# Patient Record
Sex: Male | Born: 1965 | Race: Black or African American | Hispanic: No | Smoking: Never smoker
Health system: Southern US, Community
[De-identification: ages and names within clinical notes are randomized; demographics above are authoritative.]

## PROBLEM LIST (undated history)

## (undated) DIAGNOSIS — J42 Unspecified chronic bronchitis: Secondary | ICD-10-CM

## (undated) DIAGNOSIS — E119 Type 2 diabetes mellitus without complications: Secondary | ICD-10-CM

## (undated) DIAGNOSIS — Q249 Congenital malformation of heart, unspecified: Secondary | ICD-10-CM

## (undated) DIAGNOSIS — N289 Disorder of kidney and ureter, unspecified: Secondary | ICD-10-CM

## (undated) DIAGNOSIS — I10 Essential (primary) hypertension: Secondary | ICD-10-CM

## (undated) HISTORY — PX: CARDIAC SURGERY: SHX584

## (undated) HISTORY — PX: NEPHRECTOMY TRANSPLANTED ORGAN: SUR880

## (undated) HISTORY — PX: INSERTION OF DIALYSIS CATHETER: SHX1324

---

## 2016-10-24 ENCOUNTER — Inpatient Hospital Stay (HOSPITAL_COMMUNITY): Payer: Medicare Other

## 2016-10-24 ENCOUNTER — Inpatient Hospital Stay (HOSPITAL_COMMUNITY)
Admission: EM | Admit: 2016-10-24 | Discharge: 2016-10-27 | DRG: 682 | Disposition: A | Discharge status: Home / Self Care | Payer: Medicare Other | Attending: Family Medicine | Admitting: Family Medicine

## 2016-10-24 ENCOUNTER — Encounter (HOSPITAL_COMMUNITY): Payer: Self-pay | Admitting: Emergency Medicine

## 2016-10-24 ENCOUNTER — Emergency Department (HOSPITAL_COMMUNITY): Payer: Medicare Other

## 2016-10-24 DIAGNOSIS — D631 Anemia in chronic kidney disease: Secondary | ICD-10-CM | POA: Diagnosis present

## 2016-10-24 DIAGNOSIS — N186 End stage renal disease: Secondary | ICD-10-CM

## 2016-10-24 DIAGNOSIS — E875 Hyperkalemia: Secondary | ICD-10-CM

## 2016-10-24 DIAGNOSIS — N179 Acute kidney failure, unspecified: Principal | ICD-10-CM

## 2016-10-24 DIAGNOSIS — I350 Nonrheumatic aortic (valve) stenosis: Secondary | ICD-10-CM

## 2016-10-24 DIAGNOSIS — Z862 Personal history of diseases of the blood and blood-forming organs and certain disorders involving the immune mechanism: Secondary | ICD-10-CM

## 2016-10-24 DIAGNOSIS — Z940 Kidney transplant status: Secondary | ICD-10-CM

## 2016-10-24 DIAGNOSIS — N184 Chronic kidney disease, stage 4 (severe): Secondary | ICD-10-CM

## 2016-10-24 DIAGNOSIS — E119 Type 2 diabetes mellitus without complications: Secondary | ICD-10-CM

## 2016-10-24 DIAGNOSIS — R600 Localized edema: Secondary | ICD-10-CM

## 2016-10-24 DIAGNOSIS — J9691 Respiratory failure, unspecified with hypoxia: Secondary | ICD-10-CM

## 2016-10-24 DIAGNOSIS — J9601 Acute respiratory failure with hypoxia: Secondary | ICD-10-CM

## 2016-10-24 DIAGNOSIS — I129 Hypertensive chronic kidney disease with stage 1 through stage 4 chronic kidney disease, or unspecified chronic kidney disease: Secondary | ICD-10-CM | POA: Diagnosis present

## 2016-10-24 DIAGNOSIS — R0600 Dyspnea, unspecified: Secondary | ICD-10-CM | POA: Diagnosis not present

## 2016-10-24 DIAGNOSIS — I5032 Chronic diastolic (congestive) heart failure: Secondary | ICD-10-CM | POA: Diagnosis present

## 2016-10-24 DIAGNOSIS — I080 Rheumatic disorders of both mitral and aortic valves: Secondary | ICD-10-CM | POA: Diagnosis present

## 2016-10-24 DIAGNOSIS — N189 Chronic kidney disease, unspecified: Secondary | ICD-10-CM

## 2016-10-24 DIAGNOSIS — R0902 Hypoxemia: Secondary | ICD-10-CM | POA: Diagnosis present

## 2016-10-24 DIAGNOSIS — E1122 Type 2 diabetes mellitus with diabetic chronic kidney disease: Secondary | ICD-10-CM | POA: Diagnosis present

## 2016-10-24 DIAGNOSIS — E8779 Other fluid overload: Secondary | ICD-10-CM

## 2016-10-24 DIAGNOSIS — Z8249 Family history of ischemic heart disease and other diseases of the circulatory system: Secondary | ICD-10-CM

## 2016-10-24 DIAGNOSIS — E8770 Fluid overload, unspecified: Secondary | ICD-10-CM

## 2016-10-24 DIAGNOSIS — J42 Unspecified chronic bronchitis: Secondary | ICD-10-CM | POA: Diagnosis present

## 2016-10-24 HISTORY — DX: Disorder of kidney and ureter, unspecified: N28.9

## 2016-10-24 HISTORY — DX: Essential (primary) hypertension: I10

## 2016-10-24 HISTORY — DX: Unspecified chronic bronchitis: J42

## 2016-10-24 HISTORY — DX: Congenital malformation of heart, unspecified: Q24.9

## 2016-10-24 HISTORY — DX: Type 2 diabetes mellitus without complications: E11.9

## 2016-10-24 LAB — BASIC METABOLIC PANEL
Anion gap: 9 (ref 5–15)
BUN: 56 mg/dL — AB (ref 6–20)
CALCIUM: 8.9 mg/dL (ref 8.9–10.3)
CHLORIDE: 104 mmol/L (ref 101–111)
CO2: 23 mmol/L (ref 22–32)
CREATININE: 5.83 mg/dL — AB (ref 0.61–1.24)
GFR, EST AFRICAN AMERICAN: 12 mL/min — AB (ref >60)
GFR, EST NON AFRICAN AMERICAN: 10 mL/min — AB (ref >60)
Glucose, Bld: 193 mg/dL — ABNORMAL HIGH (ref 65–99)
Potassium: 5.7 mmol/L — ABNORMAL HIGH (ref 3.5–5.1)
SODIUM: 136 mmol/L (ref 135–145)

## 2016-10-24 LAB — CBC WITH DIFFERENTIAL/PLATELET
BASOS PCT: 0 %
Basophils Absolute: 0 10*3/uL (ref 0.0–0.1)
EOS ABS: 0 10*3/uL (ref 0.0–0.7)
EOS PCT: 0 %
HCT: 43 % (ref 39.0–52.0)
HEMOGLOBIN: 13.7 g/dL (ref 13.0–17.0)
Lymphocytes Relative: 6 %
Lymphs Abs: 0.3 10*3/uL — ABNORMAL LOW (ref 0.7–4.0)
MCH: 28.4 pg (ref 26.0–34.0)
MCHC: 31.9 g/dL (ref 30.0–36.0)
MCV: 89.2 fL (ref 78.0–100.0)
MONOS PCT: 1 %
Monocytes Absolute: 0.1 10*3/uL (ref 0.1–1.0)
NEUTROS PCT: 93 %
Neutro Abs: 4.4 10*3/uL (ref 1.7–7.7)
PLATELETS: 207 10*3/uL (ref 150–400)
RBC: 4.82 MIL/uL (ref 4.22–5.81)
RDW: 14.6 % (ref 11.5–15.5)
WBC: 4.8 10*3/uL (ref 4.0–10.5)

## 2016-10-24 LAB — ECHOCARDIOGRAM COMPLETE
AO mean calculated velocity dopler: 222 cm/s
AOPV: 0.32 m/s
AOVTI: 67.2 cm
AV Area VTI: 0.73 cm2
AV Mean grad: 23 mmHg
AV area mean vel ind: 0.35 cm2/m2
AV vel: 0.77
AVA: 0.77 cm2
AVAREAMEANV: 0.7 cm2
AVAREAVTIIND: 0.39 cm2/m2
AVCELMEANRAT: 0.31
AVLVOTPG: 5 mmHg
AVPG: 45 mmHg
AVPKVEL: 334 cm/s
CHL CUP AV PEAK INDEX: 0.37
CHL CUP AV VALUE AREA INDEX: 0.39
CHL CUP DOP CALC LVOT VTI: 22.7 cm
EERAT: 19.68
EWDT: 201 ms
FS: 40 % (ref 28–44)
Height: 67 in
IVS/LV PW RATIO, ED: 0.98
LA diam end sys: 49 mm
LADIAMINDEX: 2.45 cm/m2
LASIZE: 49 mm
LAVOL: 130 mL
LAVOLA4C: 112 mL
LAVOLIN: 65.1 mL/m2
LDCA: 2.27 cm2
LV E/e' medial: 19.68
LV PW d: 15.2 mm — AB (ref 0.6–1.1)
LV TDI E'LATERAL: 6.96
LV dias vol index: 48 mL/m2
LV e' LATERAL: 6.96 cm/s
LV sys vol: 33 mL (ref 21–61)
LVDIAVOL: 97 mL (ref 62–150)
LVEEAVG: 19.68
LVOT SV: 52 mL
LVOT diameter: 17 mm
LVOT peak VTI: 0.34 cm
LVOTPV: 107 cm/s
LVSYSVOLIN: 16 mL/m2
MV Dec: 201
MV pk A vel: 76.4 m/s
MV pk E vel: 137 m/s
MVPG: 8 mmHg
RV LATERAL S' VELOCITY: 16.8 cm/s
RV TAPSE: 24.3 mm
Simpson's disk: 66
Stroke v: 64 ml
TDI e' medial: 7.72
Weight: 2913.6 oz

## 2016-10-24 LAB — GLUCOSE, CAPILLARY
GLUCOSE-CAPILLARY: 110 mg/dL — AB (ref 65–99)
GLUCOSE-CAPILLARY: 214 mg/dL — AB (ref 65–99)
GLUCOSE-CAPILLARY: 159 mg/dL — AB (ref 65–99)
Glucose-Capillary: 252 mg/dL — ABNORMAL HIGH (ref 65–99)

## 2016-10-24 LAB — RENAL FUNCTION PANEL
Albumin: 2.8 g/dL — ABNORMAL LOW (ref 3.5–5.0)
Anion gap: 8 (ref 5–15)
BUN: 56 mg/dL — AB (ref 6–20)
CALCIUM: 8.8 mg/dL — AB (ref 8.9–10.3)
CHLORIDE: 102 mmol/L (ref 101–111)
CO2: 26 mmol/L (ref 22–32)
CREATININE: 5.86 mg/dL — AB (ref 0.61–1.24)
GFR calc Af Amer: 12 mL/min — ABNORMAL LOW (ref >60)
GFR, EST NON AFRICAN AMERICAN: 10 mL/min — AB (ref >60)
Glucose, Bld: 142 mg/dL — ABNORMAL HIGH (ref 65–99)
Phosphorus: 5.5 mg/dL — ABNORMAL HIGH (ref 2.5–4.6)
Potassium: 4.5 mmol/L (ref 3.5–5.1)
SODIUM: 136 mmol/L (ref 135–145)

## 2016-10-24 LAB — URINALYSIS, ROUTINE W REFLEX MICROSCOPIC
Bilirubin Urine: NEGATIVE
GLUCOSE, UA: 250 mg/dL — AB
KETONES UR: NEGATIVE mg/dL
Leukocytes, UA: NEGATIVE
Nitrite: NEGATIVE
PH: 5.5 (ref 5.0–8.0)
Specific Gravity, Urine: 1.025 (ref 1.005–1.030)

## 2016-10-24 LAB — URINE MICROSCOPIC-ADD ON

## 2016-10-24 LAB — TROPONIN I

## 2016-10-24 LAB — BRAIN NATRIURETIC PEPTIDE: B NATRIURETIC PEPTIDE 5: 670 pg/mL — AB (ref 0.0–100.0)

## 2016-10-24 LAB — MRSA PCR SCREENING: MRSA BY PCR: NEGATIVE

## 2016-10-24 MED ORDER — ONDANSETRON HCL 4 MG PO TABS: 4.0000 mg | ORAL_TABLET | Freq: Four times a day (QID) | ORAL | Status: DC | PRN

## 2016-10-24 MED ORDER — LABETALOL HCL 200 MG PO TABS
600.0000 mg | ORAL_TABLET | Freq: Two times a day (BID) | ORAL | Status: DC
  Administered 2016-10-24 – 2016-10-27 (×7): 600 mg via ORAL
  Filled 2016-10-24 (×7): qty 3

## 2016-10-24 MED ORDER — SODIUM CHLORIDE 0.9 % IV SOLN: 250.0000 mL | INTRAVENOUS | Status: DC | PRN

## 2016-10-24 MED ORDER — OXYCODONE-ACETAMINOPHEN 5-325 MG PO TABS
1.0000 | ORAL_TABLET | Freq: Once | ORAL | Status: AC
  Administered 2016-10-24: 1 via ORAL
  Filled 2016-10-24: qty 1

## 2016-10-24 MED ORDER — SODIUM CHLORIDE 0.9% FLUSH: 3.0000 mL | INTRAVENOUS | Status: DC | PRN

## 2016-10-24 MED ORDER — ACETAMINOPHEN 325 MG PO TABS
650.0000 mg | ORAL_TABLET | Freq: Four times a day (QID) | ORAL | Status: DC | PRN
Start: 2016-10-24 — End: 2016-10-27
  Administered 2016-10-24 – 2016-10-25 (×2): 650 mg via ORAL
  Filled 2016-10-24 (×2): qty 2

## 2016-10-24 MED ORDER — ONDANSETRON HCL 4 MG/2ML IJ SOLN
4.0000 mg | Freq: Four times a day (QID) | INTRAMUSCULAR | Status: DC | PRN
  Administered 2016-10-24: 4 mg via INTRAVENOUS
  Filled 2016-10-24: qty 2

## 2016-10-24 MED ORDER — INSULIN ASPART 100 UNIT/ML IV SOLN
10.0000 [IU] | Freq: Once | INTRAVENOUS | Status: AC
  Administered 2016-10-24: 10 [IU] via INTRAVENOUS

## 2016-10-24 MED ORDER — PANTOPRAZOLE SODIUM 40 MG PO TBEC
40.0000 mg | DELAYED_RELEASE_TABLET | Freq: Two times a day (BID) | ORAL | Status: DC
  Administered 2016-10-24 – 2016-10-27 (×7): 40 mg via ORAL
  Filled 2016-10-24 (×7): qty 1

## 2016-10-24 MED ORDER — FUROSEMIDE 10 MG/ML IJ SOLN
80.0000 mg | Freq: Once | INTRAMUSCULAR | Status: AC
  Administered 2016-10-24: 80 mg via INTRAVENOUS
  Filled 2016-10-24: qty 8

## 2016-10-24 MED ORDER — SODIUM CHLORIDE 0.9 % IV SOLN
1.0000 g | Freq: Once | INTRAVENOUS | Status: AC
  Administered 2016-10-24: 1 g via INTRAVENOUS
  Filled 2016-10-24: qty 10

## 2016-10-24 MED ORDER — ALBUTEROL SULFATE (2.5 MG/3ML) 0.083% IN NEBU
2.5000 mg | INHALATION_SOLUTION | Freq: Four times a day (QID) | RESPIRATORY_TRACT | Status: DC
  Administered 2016-10-24 – 2016-10-27 (×13): 2.5 mg via RESPIRATORY_TRACT
  Filled 2016-10-24 (×13): qty 3

## 2016-10-24 MED ORDER — ACETAMINOPHEN 650 MG RE SUPP
650.0000 mg | Freq: Four times a day (QID) | RECTAL | Status: DC | PRN
Start: 2016-10-24 — End: 2016-10-27

## 2016-10-24 MED ORDER — FUROSEMIDE 10 MG/ML IJ SOLN: 80.0000 mg | Freq: Two times a day (BID) | INTRAMUSCULAR | Status: DC

## 2016-10-24 MED ORDER — PREDNISONE 10 MG PO TABS
10.0000 mg | ORAL_TABLET | Freq: Every day | ORAL | Status: DC
  Administered 2016-10-24 – 2016-10-27 (×4): 10 mg via ORAL
  Filled 2016-10-24 (×4): qty 1

## 2016-10-24 MED ORDER — TACROLIMUS 1 MG PO CAPS
4.0000 mg | ORAL_CAPSULE | Freq: Two times a day (BID) | ORAL | Status: DC
  Administered 2016-10-24 – 2016-10-27 (×7): 4 mg via ORAL
  Filled 2016-10-24 (×13): qty 4

## 2016-10-24 MED ORDER — HEPARIN SODIUM (PORCINE) 5000 UNIT/ML IJ SOLN
5000.0000 [IU] | Freq: Three times a day (TID) | INTRAMUSCULAR | Status: DC
  Administered 2016-10-24 – 2016-10-27 (×10): 5000 [IU] via SUBCUTANEOUS
  Filled 2016-10-24 (×10): qty 1

## 2016-10-24 MED ORDER — DEXTROSE 5 % IV SOLN
120.0000 mg | Freq: Two times a day (BID) | INTRAVENOUS | Status: DC
  Administered 2016-10-24 – 2016-10-26 (×6): 120 mg via INTRAVENOUS
  Filled 2016-10-24 (×13): qty 12

## 2016-10-24 MED ORDER — INSULIN ASPART 100 UNIT/ML ~~LOC~~ SOLN
SUBCUTANEOUS | Status: AC
  Filled 2016-10-24: qty 1

## 2016-10-24 MED ORDER — DEXTROSE 50 % IV SOLN
INTRAVENOUS | Status: AC
  Filled 2016-10-24: qty 50

## 2016-10-24 MED ORDER — SODIUM POLYSTYRENE SULFONATE 15 GM/60ML PO SUSP
15.0000 g | Freq: Once | ORAL | Status: AC
  Administered 2016-10-24: 15 g via ORAL
  Filled 2016-10-24: qty 60

## 2016-10-24 MED ORDER — SODIUM CHLORIDE 0.9% FLUSH
3.0000 mL | Freq: Two times a day (BID) | INTRAVENOUS | Status: DC
  Administered 2016-10-24 – 2016-10-26 (×5): 3 mL via INTRAVENOUS

## 2016-10-24 MED ORDER — INSULIN ASPART 100 UNIT/ML ~~LOC~~ SOLN
0.0000 [IU] | Freq: Three times a day (TID) | SUBCUTANEOUS | Status: DC
  Administered 2016-10-24: 3 [IU] via SUBCUTANEOUS
  Administered 2016-10-24: 1 [IU] via SUBCUTANEOUS
  Administered 2016-10-25 (×2): 2 [IU] via SUBCUTANEOUS
  Administered 2016-10-26: 5 [IU] via SUBCUTANEOUS
  Administered 2016-10-27: 3 [IU] via SUBCUTANEOUS

## 2016-10-24 MED ORDER — DEXTROSE 50 % IV SOLN
1.0000 | Freq: Once | INTRAVENOUS | Status: AC
  Administered 2016-10-24: 50 mL via INTRAVENOUS

## 2016-10-24 NOTE — Care Management Note (Signed)
Case Management Note  Patient Details  Name: Henry Gibson MRN: [name] Date of Birth: 15-May-1966  Subjective/Objective:                  Pt admitted with AKI. He is from home, lives with children and is ind with ADL's. He had a kidney transplant 10 years ago. He is followed primarily by his transplant team. He has no HH services or DME needs PTA. He has no difficulty with transportation or obtaining medications. He plans to return home with self care. Possibility of needing HD if renal function does not improve, CSW is aware.   Action/Plan: Will cont to follow.   Expected Discharge Date:    10/26/2016               Expected Discharge Plan:  Home/Self Care  In-House Referral:  Clinical Social Work  Discharge planning Services  CM Consult  Post Acute Care Choice:  NA Choice offered to:  NA  Status of Service:  In process, will continue to follow  Henry Gibson, Henry Mendel Demske, RN 10/24/2016, 2:10 PM

## 2016-10-24 NOTE — H&P (Addendum)
History and Physical    Henry Gibson WUJ:[id number] DOB: 1966-03-26 DOA: 10/24/2016  PCP: No PCP Per Patient  Patient coming from: Home   Chief Complaint: SOB   HPI: Henry Gibson is a 50 y.o. male with medical history significant of CKD stage IV, last cr per records care everywhere was at 3.08 Oct 2015, renal transplant almost 10 years ago. Patient report one week ago his Cr was 4. He was seen another hospital for knee problems, swelling. He also report SOB 3 weeks ago, he was hospitalized and treated with nebulizer, which helps. He presents today complaining of SOB at rest and on exertion that started 3 days. Ago. He report that he has been making urine but less, he has not been drinking fluids well.  He denies chest pain, he report bilateral Lower extremity edema worse on the right. He is complaining of '' fever''on his right knee.   ED Course: Presents with dyspnea, oxygen sat 89 on RA, k at 5.7, cr at 5.8, BNP 676, chest x ray ; Vascular congestion and mild cardiomegaly. Increased interstitial markings and left midlung opacity may reflect pulmonary edema or pneumonia. Small bilateral pleural effusions seen. He received 80 mg IV lasix, kayexalate, insulin, glucose.    Review of Systems: As per HPI otherwise 10 point review of systems negative.    Past Medical History:  Diagnosis Date  . Chronic bronchitis (HCC)   . Congenital heart anomaly   . Diabetes mellitus without complication (HCC)   . Hypertension   . Renal disorder     Past Surgical History:  Procedure Laterality Date  . CARDIAC SURGERY    . INSERTION OF DIALYSIS CATHETER    . NEPHRECTOMY TRANSPLANTED ORGAN       reports that he has never smoked. He has never used smokeless tobacco. He reports that he does not drink alcohol or use drugs.  No Known Allergies  Family History: Kidney diseases, heart diseases.    Prior to Admission medications   Not on File  Norvasc. Clonidine Prograf.  Glipizide. Labetalol.    Physical Exam: Vitals:   10/24/16 0300 10/24/16 0315 10/24/16 0330 10/24/16 0345  BP: 128/99  126/79   Pulse:  84 84   Resp: (!) 30 (!) 33 (!) 27 (!) 30  Temp:      TempSrc:      SpO2:  95% 97%   Weight:      Height:          Constitutional: NAD, calm, comfortable Vitals:   10/24/16 0300 10/24/16 0315 10/24/16 0330 10/24/16 0345  BP: 128/99  126/79   Pulse:  84 84   Resp: (!) 30 (!) 33 (!) 27 (!) 30  Temp:      TempSrc:      SpO2:  95% 97%   Weight:      Height:       Eyes: PERRL, lids and conjunctivae normal ENMT: Mucous membranes are moist. Posterior pharynx clear of any exudate or lesions.Normal dentition.  Neck: normal, supple, no masses, no thyromegaly Respiratory: sporadic  Wheezing, bilateral  crackles. Normal respiratory effort. No accessory muscle use. Speaking full sentences. Cardiovascular: Regular rate and rhythm, murmurs present /no  rubs / gallops. Plus 2 extremity edema. 2+ pedal pulses. No carotid bruits.  Abdomen: no tenderness, no masses palpated. No hepatosplenomegaly. Bowel sounds positive.  Musculoskeletal: no clubbing / cyanosis. No joint deformity upper and lower extremities. Good ROM, no contractures. Normal muscle tone.  Skin: no rashes, lesions, ulcers.  No induration Neurologic: CN 2-12 grossly intact. Sensation intact, DTR normal. Strength 5/5 in all 4.  Psychiatric: Normal judgment and insight. Alert and oriented x 3. Normal mood.     Labs on Admission: I have personally reviewed following labs and imaging studies  CBC:  Recent Labs Lab 10/24/16 0049  WBC 4.8  NEUTROABS 4.4  HGB 13.7  HCT 43.0  MCV 89.2  PLT 207   Basic Metabolic Panel:  Recent Labs Lab 10/24/16 0049  NA 136  K 5.7*  CL 104  CO2 23  GLUCOSE 193*  BUN 56*  CREATININE 5.83*  CALCIUM 8.9   GFR: Estimated Creatinine Clearance: 15.8 mL/min (by C-G formula based on SCr of 5.83 mg/dL (H)). Liver Function Tests: No results for input(s): AST, ALT, ALKPHOS,  BILITOT, PROT, ALBUMIN in the last 168 hours. No results for input(s): LIPASE, AMYLASE in the last 168 hours. No results for input(s): AMMONIA in the last 168 hours. Coagulation Profile: No results for input(s): INR, PROTIME in the last 168 hours. Cardiac Enzymes:  Recent Labs Lab 10/24/16 0049  TROPONINI <0.03   BNP (last 3 results) No results for input(s): PROBNP in the last 8760 hours. HbA1C: No results for input(s): HGBA1C in the last 72 hours. CBG: No results for input(s): GLUCAP in the last 168 hours. Lipid Profile: No results for input(s): CHOL, HDL, LDLCALC, TRIG, CHOLHDL, LDLDIRECT in the last 72 hours. Thyroid Function Tests: No results for input(s): TSH, T4TOTAL, FREET4, T3FREE, THYROIDAB in the last 72 hours. Anemia Panel: No results for input(s): VITAMINB12, FOLATE, FERRITIN, TIBC, IRON, RETICCTPCT in the last 72 hours. Urine analysis: No results found for: COLORURINE, APPEARANCEUR, LABSPEC, PHURINE, GLUCOSEU, HGBUR, BILIRUBINUR, KETONESUR, PROTEINUR, UROBILINOGEN, NITRITE, LEUKOCYTESUR Sepsis Labs: !!!!!!!!!!!!!!!!!!!!!!!!!!!!!!!!!!!!!!!!!!!! @LABRCNTIP (procalcitonin:4,lacticidven:4) )No results found for this or any previous visit (from the past 240 hour(s)).   Radiological Exams on Admission: Dg Chest 2 View  Result Date: 10/24/2016 CLINICAL DATA:  Acute onset of worsening shortness of breath and congestion. Initial encounter. EXAM: CHEST  2 VIEW COMPARISON:  None. FINDINGS: The lungs are well-aerated. Vascular congestion is noted. Increased interstitial markings and left midlung opacity may reflect pulmonary edema or pneumonia. Small bilateral pleural effusions are seen. No pneumothorax is identified. The heart is mildly enlarged. No acute osseous abnormalities are seen. IMPRESSION: Vascular congestion and mild cardiomegaly. Increased interstitial markings and left midlung opacity may reflect pulmonary edema or pneumonia. Small bilateral pleural effusions seen.  Electronically Signed   By: Roanna RaiderJeffery  Chang M.D.   On: 10/24/2016 01:44    EKG: Independently reviewed. Sinus peak t waves.   Assessment/Plan Active Problems:   Acute on chronic renal failure (HCC)   Respiratory failure with hypoxia (HCC)   Hyperkalemia   Hypoxemia  1-Acute on chronic renal failure;  History of renal transplant. Per patient cr last week was at 4. It has been at 4 since [place].  Will check UA> when he is able to provide sample.  IV lasix due to pulmonary edema.  Nephrology consulted.  Continue with immune suppressive therapy. Check prograf level.  Strict I and O.   2-Acute hypoxic Respiratory failure; chest x ray with pulmonary edema, likely in the event of renal failure/  IV lasix 80 mg IV BID.  Nephrology consulted.  Nebulizer treatment.  Check echo to evaluate for HF, also patient with murmur.   BIPAP PRN for increase Work of breathing   3-Hyperkalemia;  In setting of renal failure.  He received kayexalate, insulin and Amp D 50.  He  will received lasix and calcium gluconate.   4-Bilateral LE edema, worse on the right; check doppler.   5-HTN; hold Norvasc, clonidine, SBP in the 120. Continue with labetalol.  6-DM; hold glipizide. Will order SSI.   DVT prophylaxis: heparin  Code Status: full code.  Family Communication: care discussed with patient.  Disposition Plan: Home at time of discharge  Consults called: nephrology  Admission status: inpatient, observation.    Alba Cory MD [facility] Pager [phone]  If 7PM-7AM, please contact night-coverage [email or web address] Password [email or web address]  10/24/2016, 3:51 AM

## 2016-10-24 NOTE — Progress Notes (Signed)
*  PRELIMINARY RESULTS* Echocardiogram 2D Echocardiogram has been performed.  Stacey DrainWhite, Gerritt Galentine J 10/24/2016, 12:54 PM

## 2016-10-24 NOTE — ED Provider Notes (Signed)
[facility]-EMERGENCY DEPT Provider Note   CSN: [id number] Arrival date & time: 10/24/16  0000  By signing my name below, I, Modena JanskyAlbert Thayil, attest that this documentation has been prepared under the direction and in the presence of Shon Batonourtney F Maille Halliwell, MD . Electronically Signed: Modena JanskyAlbert Thayil, Scribe. 10/24/2016. 12:30 AM.  History   Chief Complaint Chief Complaint  Patient presents with  . Shortness of Breath  . Joint Swelling   The history is provided by the patient. No language interpreter was used.   HPI Comments: Henry Gibson is a 50 y.o. male with a hx of chronic bronchitis who presents to the Emergency Department complaining of intermittent moderate SOB that started yesterday. He states has been having trouble breathing, BLE swelling, and BLE pain. He reports having fluid removed from his right knee recently. He states no modifying factors. He admits to use of "fluid pills". He denies any fever or other complaints.   History of renal transplant. Reports decreased urine output over last several days. Is not currently on dialysis. Does have a history of heart failure.  [place] [facility] [place]. [place] most of [place] at [place] and [place].  [facility] in [place].  Patient reports last hospitalization in [place] was in March 2017. At that time he reports that his creatinine had increased to 8. He was discharged with a creatinine of 4.  This is been his baseline since per the patient.  Past Medical History:  Diagnosis Date  . Chronic bronchitis (HCC)   . Congenital heart anomaly   . Diabetes mellitus without complication (HCC)   . Hypertension   . Renal disorder     Patient Active Problem List   Diagnosis Date Noted  . Acute on chronic renal failure (HCC) 10/24/2016  . Respiratory failure with hypoxia (HCC) 10/24/2016  . Hyperkalemia 10/24/2016    Past Surgical History:  Procedure Laterality Date  . CARDIAC  SURGERY    . INSERTION OF DIALYSIS CATHETER    . NEPHRECTOMY TRANSPLANTED ORGAN         Home Medications    Prior to Admission medications   Not on File    Family History History reviewed. No pertinent family history.  Social History Social History  Substance Use Topics  . Smoking status: Never Smoker  . Smokeless tobacco: Never Used  . Alcohol use No     Allergies   Patient has no known allergies.   Review of Systems Review of Systems  Constitutional: Negative for fever.  Respiratory: Positive for cough and shortness of breath.   Cardiovascular: Positive for leg swelling. Negative for chest pain.  Gastrointestinal: Negative for nausea and vomiting.  Genitourinary:       Decreased urination  Musculoskeletal:       Right knee pain  Skin: Negative for color change and wound.  All other systems reviewed and are negative.    Physical Exam Updated Vital Signs BP 145/83 (BP Location: Right Arm)   Pulse 77   Temp 97.6 F (36.4 C) (Oral)   Resp 20   Ht 5\' 7"  (1.702 m)   Wt 180 lb (81.6 kg)   SpO2 91%   BMI 28.19 kg/m   Physical Exam  Constitutional: He is oriented to person, place, and time. No distress.  Chronically ill-appearing, no acute distress  HENT:  Head: Normocephalic and atraumatic.  Neck: Neck supple.  Cardiovascular: Normal rate, regular rhythm and normal heart sounds.   No murmur  heard. Pulmonary/Chest: He has wheezes.  Mild tachypnea noted, crackles bilateral bases, scant expiratory wheeze  Abdominal: Soft. Bowel sounds are normal. There is no tenderness. There is no rebound.  Musculoskeletal: He exhibits edema.  2+ pitting lower extremity edema to the midshin, diffuse swelling of the right knee, no overlying skin changes, normal range of motion  Neurological: He is alert and oriented to person, place, and time.  Skin: Skin is warm and dry.  Psychiatric: He has a normal mood and affect.  Nursing note and vitals reviewed.    ED  Treatments / Results  DIAGNOSTIC STUDIES: Oxygen Saturation is 91% on RA, normal by my interpretation.    COORDINATION OF CARE: 12:34 AM- Pt advised of plan for treatment and pt agrees.  Labs (all labs ordered are listed, but only abnormal results are displayed) Labs Reviewed  CBC WITH DIFFERENTIAL/PLATELET - Abnormal; Notable for the following:       Result Value   Lymphs Abs 0.3 (*)    All other components within normal limits  BASIC METABOLIC PANEL - Abnormal; Notable for the following:    Potassium 5.7 (*)    Glucose, Bld 193 (*)    BUN 56 (*)    Creatinine, Ser 5.83 (*)    GFR calc non Af Amer 10 (*)    GFR calc Af Amer 12 (*)    All other components within normal limits  BRAIN NATRIURETIC PEPTIDE - Abnormal; Notable for the following:    B Natriuretic Peptide 670.0 (*)    All other components within normal limits  TROPONIN I  URINALYSIS, ROUTINE W REFLEX MICROSCOPIC (NOT AT [facility])    EKG  EKG Interpretation  Date/Time:  Tuesday October 24 2016 00:53:27 EST Ventricular Rate:  75 PR Interval:    QRS Duration: 102 QT Interval:  403 QTC Calculation: 454 R Axis:   78 Text Interpretation:  Sinus rhythm Borderline prolonged PR interval Anterior infarct, old Borderline ST elevation, lateral leads Baseline wander no prior for comparison Confirmed by Elhadji Pecore  MD, Lemya Greenwell ([id number]) on 10/24/2016 12:58:14 AM       Radiology Dg Chest 2 View  Result Date: 10/24/2016 CLINICAL DATA:  Acute onset of worsening shortness of breath and congestion. Initial encounter. EXAM: CHEST  2 VIEW COMPARISON:  None. FINDINGS: The lungs are well-aerated. Vascular congestion is noted. Increased interstitial markings and left midlung opacity may reflect pulmonary edema or pneumonia. Small bilateral pleural effusions are seen. No pneumothorax is identified. The heart is mildly enlarged. No acute osseous abnormalities are seen. IMPRESSION: Vascular congestion and mild cardiomegaly. Increased  interstitial markings and left midlung opacity may reflect pulmonary edema or pneumonia. Small bilateral pleural effusions seen. Electronically Signed   By: Roanna RaiderJeffery  Chang M.D.   On: 10/24/2016 01:44    Procedures Procedures (including critical care time)  CRITICAL CARE Performed by: Shon BatonHORTON, Arhianna Ebey F   Total critical care time: 45 minutes  Critical care time was exclusive of separately billable procedures and treating other patients.  Critical care was necessary to treat or prevent imminent or life-threatening deterioration.  Critical care was time spent personally by me on the following activities: development of treatment plan with patient and/or surrogate as well as nursing, discussions with consultants, evaluation of patient's response to treatment, examination of patient, obtaining history from patient or surrogate, ordering and performing treatments and interventions, ordering and review of laboratory studies, ordering and review of radiographic studies, pulse oximetry and re-evaluation of patient's condition.   Medications Ordered in ED  Medications  furosemide (LASIX) injection 80 mg (not administered)  oxyCODONE-acetaminophen (PERCOCET/ROXICET) 5-325 MG per tablet 1 tablet (1 tablet Oral Given 10/24/16 0044)  sodium polystyrene (KAYEXALATE) 15 GM/60ML suspension 15 g (15 g Oral Given 10/24/16 0209)  insulin aspart (novoLOG) injection 10 Units (10 Units Intravenous Given 10/24/16 0211)  dextrose 50 % solution 50 mL (50 mLs Intravenous Given 10/24/16 0210)     Initial Impression / Assessment and Plan / ED Course  I have reviewed the triage vital signs and the nursing notes.  Pertinent labs & imaging results that were available during my care of the patient were reviewed by me and considered in my medical decision making (see chart for details).  Clinical Course     Patient presents with shortness of breath. O2 sats on room air 89%. He is requiring a small amount by nasal  cannula. No acute respiratory distress. Does have crackles and appears volume overloaded on exam. Lab work obtained. Lab work notable for potassium of 5.7 and a creatinine of 5.8 which per the patient is an increase from prior. No hyperkalemic EKG changes. Patient was given insulin and D50. He was also given Kayexalate. Chest x-ray shows evidence of vascular congestion and small bilateral pleural effusions. Suspect this is related to the patient's renal failure and decreased urine output resulting in volume overload. Nephrology was consulted. Attempt to contact on-call nephrologist for greater than one hour was unsuccessful. Patient does not need emergent dialysis right now but will need nephrology consult later today. Discussed with hospitalist. After discussion with the hospitalist, will administer 80 mg IV Lasix to attempt to diurese.  Patient will be admitted to the stepdown unit for further monitoring. We'll continue to attempt to contact on-call nephrologist.    Final Clinical Impressions(s) / ED Diagnoses   Final diagnoses:  ESRD (end stage renal disease) (HCC)  Other hypervolemia  Hyperkalemia    New Prescriptions New Prescriptions   No medications on file   I personally performed the services described in this documentation, which was scribed in my presence. The recorded information has been reviewed and is accurate.     Shon Baton, MD 10/24/16 [phone]

## 2016-10-24 NOTE — ED Triage Notes (Signed)
Pt states that he has been having SOB.  [facility] for [facility] and was told he has chronic bronchitis.  Pt states [facility] and had fluid removed from his knee.  Having swelling again

## 2016-10-24 NOTE — Consult Note (Signed)
Reason for Consult: Hyperkalemia and worsening of renal failure Referring Physician: Dr. Lowella Fairy is an 50 y.o. male.  HPI: He is a patient who has history of hypertension, diabetes, end-stage renal disease status post kidney transplant presently came his complaints of difficulty breathing, decreased urine output and increase in leg swelling. According to the patient has this recurrent problem and was admitted in the hospital about 2 weeks ago where he was found to have a creatinine of 7. Patient was managed with diuretics and his creatinine came down to 4 which is above his baseline and discharged home. Presently he came with similar problem. Patient denies any nausea or vomiting. His appetite however is poor. His baseline creatinine was around 2 since he gets his kidney transplant about 10 years ago. However starting less than April his creatinine went up and stay around 4. Patient lives in [place] and his transplant physicians in at [place].  Past Medical History:  Diagnosis Date  . Chronic bronchitis ([place])   . Congenital heart anomaly   . Diabetes mellitus without complication ([place])   . Hypertension   . Renal disorder     Past Surgical History:  Procedure Laterality Date  . CARDIAC SURGERY    . INSERTION OF DIALYSIS CATHETER    . NEPHRECTOMY TRANSPLANTED ORGAN      History reviewed. No pertinent family history.  Social History:  reports that he has never smoked. He has never used smokeless tobacco. He reports that he does not drink alcohol or use drugs.  Allergies: No Known Allergies  Medications: I have reviewed the patient's current medications.  Results for orders placed or performed during the hospital encounter of 10/24/16 (from the past 48 hour(s))  CBC with Differential     Status: Abnormal   Collection Time: 10/24/16 12:49 AM  Result Value Ref Range   WBC 4.8 4.0 - 10.5 K/uL   RBC 4.82 4.22 - 5.81 MIL/uL   Hemoglobin 13.7 13.0 - 17.0 g/dL   HCT 43.0 39.0 -  52.0 %   MCV 89.2 78.0 - 100.0 fL   MCH 28.4 26.0 - 34.0 pg   MCHC 31.9 30.0 - 36.0 g/dL   RDW 14.6 11.5 - 15.5 %   Platelets 207 150 - 400 K/uL   Neutrophils Relative % 93 %   Neutro Abs 4.4 1.7 - 7.7 K/uL   Lymphocytes Relative 6 %   Lymphs Abs 0.3 (L) 0.7 - 4.0 K/uL   Monocytes Relative 1 %   Monocytes Absolute 0.1 0.1 - 1.0 K/uL   Eosinophils Relative 0 %   Eosinophils Absolute 0.0 0.0 - 0.7 K/uL   Basophils Relative 0 %   Basophils Absolute 0.0 0.0 - 0.1 K/uL  Basic metabolic panel     Status: Abnormal   Collection Time: 10/24/16 12:49 AM  Result Value Ref Range   Sodium 136 135 - 145 mmol/L   Potassium 5.7 (H) 3.5 - 5.1 mmol/L   Chloride 104 101 - 111 mmol/L   CO2 23 22 - 32 mmol/L   Glucose, Bld 193 (H) 65 - 99 mg/dL   BUN 56 (H) 6 - 20 mg/dL   Creatinine, Ser 5.83 (H) 0.61 - 1.24 mg/dL   Calcium 8.9 8.9 - 10.3 mg/dL   GFR calc non Af Amer 10 (L) >60 mL/min   GFR calc Af Amer 12 (L) >60 mL/min    Comment: (NOTE) The eGFR has been calculated using the CKD EPI equation. This calculation has not been  validated in all clinical situations. eGFR's persistently <60 mL/min signify possible Chronic Kidney Disease.    Anion gap 9 5 - 15  Brain natriuretic peptide     Status: Abnormal   Collection Time: 10/24/16 12:49 AM  Result Value Ref Range   B Natriuretic Peptide 670.0 (H) 0.0 - 100.0 pg/mL  Troponin I     Status: None   Collection Time: 10/24/16 12:49 AM  Result Value Ref Range   Troponin I <0.03 <0.03 ng/mL  Urinalysis, Routine w reflex microscopic (not at [facility])     Status: Abnormal   Collection Time: 10/24/16  3:37 AM  Result Value Ref Range   Color, Urine YELLOW YELLOW   APPearance CLEAR CLEAR   Specific Gravity, Urine 1.025 1.005 - 1.030   pH 5.5 5.0 - 8.0   Glucose, UA 250 (A) NEGATIVE mg/dL   Hgb urine dipstick SMALL (A) NEGATIVE   Bilirubin Urine NEGATIVE NEGATIVE   Ketones, ur NEGATIVE NEGATIVE mg/dL   Protein, ur >300 (A) NEGATIVE mg/dL   Nitrite  NEGATIVE NEGATIVE   Leukocytes, UA NEGATIVE NEGATIVE  Urine microscopic-add on     Status: Abnormal   Collection Time: 10/24/16  3:37 AM  Result Value Ref Range   Squamous Epithelial / LPF 0-5 (A) NONE SEEN   WBC, UA 0-5 0 - 5 WBC/hpf   RBC / HPF 0-5 0 - 5 RBC/hpf   Bacteria, UA FEW (A) NONE SEEN   Casts HYALINE CASTS (A) NEGATIVE    Comment: GRANULAR CAST  Glucose, capillary     Status: Abnormal   Collection Time: 10/24/16  7:40 AM  Result Value Ref Range   Glucose-Capillary 110 (H) 65 - 99 mg/dL    Dg Chest 2 View  Result Date: 10/24/2016 CLINICAL DATA:  Acute onset of worsening shortness of breath and congestion. Initial encounter. EXAM: CHEST  2 VIEW COMPARISON:  None. FINDINGS: The lungs are well-aerated. Vascular congestion is noted. Increased interstitial markings and left midlung opacity may reflect pulmonary edema or pneumonia. Small bilateral pleural effusions are seen. No pneumothorax is identified. The heart is mildly enlarged. No acute osseous abnormalities are seen. IMPRESSION: Vascular congestion and mild cardiomegaly. Increased interstitial markings and left midlung opacity may reflect pulmonary edema or pneumonia. Small bilateral pleural effusions seen. Electronically Signed   By: Garald Balding M.D.   On: 10/24/2016 01:44    Review of Systems  Constitutional: Positive for malaise/fatigue. Negative for chills and fever.  Respiratory: Positive for shortness of breath and wheezing. Negative for cough.   Cardiovascular: Positive for orthopnea and leg swelling.  Gastrointestinal: Negative for abdominal pain, diarrhea, nausea and vomiting.   Blood pressure (!) 145/101, pulse 95, temperature 97 F (36.1 C), temperature source Oral, resp. rate (!) 37, height _0  (1.702 m), weight 82.6 kg (182 lb 1.6 oz), SpO2 (!) 86 %. Physical Exam  Constitutional: He is oriented to person, place, and time. No distress.  Eyes: No scleral icterus.  Neck: JVD present.  Cardiovascular:  Normal rate and regular rhythm.   Murmur heard. Respiratory: No respiratory distress. He has no wheezes. He has rales.  GI: He exhibits no distension. There is no tenderness.  Musculoskeletal: He exhibits edema.  Neurological: He is alert and oriented to person, place, and time.    Assessment/Plan: Problem #1 hyperkalemia: Possibly secondary to high potassium intake and worsening of renal failure. Problem #2 history of chronic renal failure: Possibly stage IV. Patient is status post kidney transplant 10 years ago. The  present increase in BUN and creatinine could be secondary to natural progression of his disease. Presently he has some nausea but no vomiting. Problem #3 difficulty breathing: This is most likely from uncontrolled fluid intake and worsening of renal failure. Patient on Lasix as an outpatient. He has significant swelling of the legs right greater than left. Presently he is feeling better. Problem #4 hypertension: His blood pressure is reasonably controlled Problem #5 diabetes Problem #6 anemia: His hemoglobin is above our target goal. Plan: 1] We'll check his renal panel today and again 2] we'll increase his Lasix to 120 mg by mouth twice a day 3] if his renal function continued to decline possibly patient may require dialysis. Have discussed with him. If his renal function stabilizes and if patient becomes asymptomatic he may need to be followed by his transplant nephrologist in [place].  Kele Barthelemy S 10/24/2016, 8:20 AM

## 2016-10-24 NOTE — ED Notes (Signed)
Pt unable to void at this time, he will inform us when he is able.

## 2016-10-24 NOTE — Progress Notes (Signed)
Patient was admitted to the hospital earlier this morning by Dr. Sunnie Nielsenegalado.  Patient seen and examined. He feels that his breathing is improving. Still has lower extremity edema. Lungs have diminished breath sounds at bases  Patient presented to the hospital with volume overload. He has a history of CKD stage 4 and had a renal transplant approximately ten years ago. [place]. Reports his baseline creatinine earlier this year was approximately 4. He presents to [facility] with creatinine of 5.8 and mild hyperkalemia. He is being followed by nephrology and started on high dose IV lasix. If urine output does not improve, may need to consider initiating dialysis. Continue current treatments.  Shiryl Ruddy

## 2016-10-25 DIAGNOSIS — N184 Chronic kidney disease, stage 4 (severe): Secondary | ICD-10-CM

## 2016-10-25 DIAGNOSIS — I5032 Chronic diastolic (congestive) heart failure: Secondary | ICD-10-CM

## 2016-10-25 DIAGNOSIS — E1122 Type 2 diabetes mellitus with diabetic chronic kidney disease: Secondary | ICD-10-CM

## 2016-10-25 DIAGNOSIS — N179 Acute kidney failure, unspecified: Secondary | ICD-10-CM

## 2016-10-25 DIAGNOSIS — J9601 Acute respiratory failure with hypoxia: Secondary | ICD-10-CM

## 2016-10-25 DIAGNOSIS — E119 Type 2 diabetes mellitus without complications: Secondary | ICD-10-CM

## 2016-10-25 DIAGNOSIS — Z862 Personal history of diseases of the blood and blood-forming organs and certain disorders involving the immune mechanism: Secondary | ICD-10-CM

## 2016-10-25 DIAGNOSIS — N189 Chronic kidney disease, unspecified: Secondary | ICD-10-CM

## 2016-10-25 DIAGNOSIS — I350 Nonrheumatic aortic (valve) stenosis: Secondary | ICD-10-CM

## 2016-10-25 LAB — GLUCOSE, CAPILLARY
GLUCOSE-CAPILLARY: 104 mg/dL — AB (ref 65–99)
GLUCOSE-CAPILLARY: 199 mg/dL — AB (ref 65–99)
Glucose-Capillary: 197 mg/dL — ABNORMAL HIGH (ref 65–99)
Glucose-Capillary: 132 mg/dL — ABNORMAL HIGH (ref 65–99)

## 2016-10-25 LAB — RENAL FUNCTION PANEL
ANION GAP: 10 (ref 5–15)
Albumin: 2.9 g/dL — ABNORMAL LOW (ref 3.5–5.0)
BUN: 62 mg/dL — ABNORMAL HIGH (ref 6–20)
CHLORIDE: 99 mmol/L — AB (ref 101–111)
CO2: 28 mmol/L (ref 22–32)
Calcium: 8.3 mg/dL — ABNORMAL LOW (ref 8.9–10.3)
Creatinine, Ser: 6.3 mg/dL — ABNORMAL HIGH (ref 0.61–1.24)
GFR calc non Af Amer: 9 mL/min — ABNORMAL LOW (ref >60)
GFR, EST AFRICAN AMERICAN: 11 mL/min — AB (ref >60)
GLUCOSE: 110 mg/dL — AB (ref 65–99)
Phosphorus: 5.8 mg/dL — ABNORMAL HIGH (ref 2.5–4.6)
Potassium: 4.5 mmol/L (ref 3.5–5.1)
Sodium: 137 mmol/L (ref 135–145)

## 2016-10-25 MED ORDER — MYCOPHENOLATE MOFETIL 250 MG PO CAPS
1000.0000 mg | ORAL_CAPSULE | Freq: Two times a day (BID) | ORAL | Status: DC
  Administered 2016-10-25 – 2016-10-27 (×5): 1000 mg via ORAL
  Filled 2016-10-25 (×11): qty 4

## 2016-10-25 MED ORDER — ASPIRIN 81 MG PO CHEW
81.0000 mg | CHEWABLE_TABLET | Freq: Every day | ORAL | Status: DC
  Administered 2016-10-25 – 2016-10-27 (×3): 81 mg via ORAL
  Filled 2016-10-25 (×3): qty 1

## 2016-10-25 NOTE — Progress Notes (Signed)
PROGRESS NOTE  Henry GoChris Mickel ZOX:[id number] DOB: [birth year] DOA: 10/24/2016 PCP: No PCP Per Patient [place] [facility] [place]. He [place] most of his care at [place] [facility] [place]. Kidney transplant was at [facility] in [place].  Patient reports last hospitalization in [place] was in March 2017  Brief Narrative: 50 year-old man PMH CKD stage IV, status post renal transplant almost 10 years ago, presented with shortness of breath at rest and on exertion for 3 days, bilateral lower shoulder with swelling. Admitted for acute kidney injury superimposed on chronic kidney disease with volume overload, pulmonary edema; acute hypoxic respiratory failure secondary to volume overload, hyperkalemia.  Assessment/Plan: 1. AKI superimposed on CKD stage IV versus CKD progression. Creatinine worsening. Urine output 2 L. -1 L since admission. Creatinine 3. 10/08/2015. May need hemodialysis soon. 2. Acute hypoxic respiratory failure secondary to volume overload, pulmonary edema, secondary to above. Intermittent BiPAP. Continue oxygen supplementation. 3. Volume overload secondary to above. Bilateral lower extremity venous Doppler negative for DVT. 4. Moderate aortic stenosis, stable. 5. Hyperkalemia. Secondary to acute kidney injury. Resolved. 6. HTN. Stable. Continue antihypertensives. Monitor carefully. 7. DM type 2. Hold glipizide. Continue SSI. 8. Anemia of chronic kidney disease. 9. Chronic diastolic congestive heart failure. 10. Chronic bronchitis. 11. Recently [facility] 3 weeks ago for shortness of breath, chronic bronchitis. Recently seen [facility], status post arthrocentesis of the knee.   Appears stable. Continue current management per nephrology.  DVT prophylaxis: Heparin Code Status: Full Family Communication: No family at bedside Disposition Plan: Discharge once improved  Brendia Sacksaniel Goodrich, MD  [facility] Direct contact:  [phone] --Via amion app OR  --[email or web address]; password [email or web address]  7PM-7AM contact night coverage as above 10/25/2016, 12:30 PM  LOS: 1 day   Consultants:  Nephrology  Procedures:  Echo Impressions:  - Moderate LVH with LVEF 65-70%. Grade 2 diastolic dysfunction.   Severe left atrial enlargement. Moderately calcified mitral   annulus with moderate, eccentric mitral regurgitation. Aortic   valve is moderately calcified with evidence of overall moderate   aortic stenosis as outlined above based on mean gradient and   dimensionless index. There is trivial aortic regurgitation.   Outside echocardiogram report from June 2016 indicates presence   of moderate aortic stenosis at that time with mean gradient 19   mmHg. Mildly dilated right ventricle with normal contraction.   Trivial tricuspid regurgitation.  Antimicrobials:  None  CC: Follow-up volume overload and SOB  Interval history/Subjective: Breathing is better. Denies any shortness of breath. His arthritis right knee is feeling much better.   ROS:  Admits to some nausea last night. He feels that most of his swelling has improved.  Objective: Vitals:   10/25/16 0813 10/25/16 0859 10/25/16 1020 10/25/16 1219  BP:   (!) 123/98   Pulse:  82 86   Resp:      Temp:      TempSrc:      SpO2: 96% 97% 97% 98%  Weight:      Height:        Intake/Output Summary (Last 24 hours) at 10/25/16 1230 Last data filed at 10/25/16 1200  Gross per 24 hour  Intake             1084 ml  Output             1775 ml  Net             -691 ml     Filed Weights   10/24/16 0019  10/24/16 0400 10/25/16 0448  Weight: 81.6 kg (180 lb) 82.6 kg (182 lb 1.6 oz) 80.6 kg (177 lb 11.1 oz)    Exam:    Constitutional:  . Appears calm and comfortable Eyes:  . PERRL and irises appear normal ENMT:  . external ears, nose appear normal . Lips appear normal Respiratory: . Marland Kitchen. Posterior Inspiratory crackles, anterior CTA bilaterally, no w/r/r.   Normal respiratory effort. Speaks in full sentences. Cardiovascular:  . RRR, no r/g, 3/6 systolic murmer . 2+ bilateral LE extremity edema, right greater than left  Musculoskeletal:  o Moves all extremities  I have personally reviewed following labs and imaging studies:  BUN 62 and creatinine 6.30, both trending up  Glucose 110, stable  Scheduled Meds: . albuterol  2.5 mg Nebulization Q6H  . aspirin  81 mg Oral Daily  . furosemide  120 mg Intravenous Q12H  . heparin  5,000 Units Subcutaneous Q8H  . insulin aspart  0-9 Units Subcutaneous TID WC  . labetalol  600 mg Oral BID  . mycophenolate  1,000 mg Oral BID  . pantoprazole  40 mg Oral BID  . predniSONE  10 mg Oral Q breakfast  . sodium chloride flush  3 mL Intravenous Q12H  . tacrolimus  4 mg Oral BID   Continuous Infusions:  Principal Problem:   AKI (acute kidney injury) (HCC) Active Problems:   Edema extremities   Hypervolemia   Chronic kidney disease (CKD), stage IV (severe) (HCC)   Acute respiratory failure with hypoxia (HCC)   Chronic diastolic CHF (congestive heart failure) (HCC)   Moderate aortic stenosis   History of anemia due to CKD   DM type 2 (diabetes mellitus, type 2) (HCC)   LOS: 1 day     By signing my name below, I, Bobbie Stackhristopher Reid, attest that this documentation has been prepared under the direction and in the presence of Daniel P. Irene LimboGoodrich, MD. Electronically signed: Bobbie Stackhristopher Reid, Scribe.  10/25/16, 10:22 AM   I personally performed the services described in this documentation. All medical record entries made by the scribe were at my direction. I have reviewed the chart and agree that the record reflects my personal performance and is accurate and complete. Brendia Sacksaniel Goodrich, MD

## 2016-10-25 NOTE — Progress Notes (Signed)
Patient requested to be placed on bipap at this time while he takes a nap. I titrated the pressures and the back up rate to his comfort. Pt tolerating well at this time. RT will inform RN of patients status and will continue to monitor.

## 2016-10-25 NOTE — Care Management Important Message (Signed)
Important Message  Patient Details  Name: Henry Gibson MRN: [id number] Date of Birth: [birth year]   Medicare Important Message Given:  Yes    Malcolm MetroChildress, Mihika Surrette Demske, RN 10/25/2016, 2:23 PM

## 2016-10-25 NOTE — Progress Notes (Signed)
Henry Gibson  MRN: [id number]  DOB/AGE: [birth year] 50 y.o.  Primary Care Physician:No PCP Per Patient  Admit date: 10/24/2016  Chief Complaint:  Chief Complaint  Patient presents with  . Shortness of Breath  . Joint Swelling    S-Pt presented on  10/24/2016 with  Chief Complaint  Patient presents with  . Shortness of Breath  . Joint Swelling  .    Pt today feels better. Pt says " I still have breathing issues but I am better than before"   Meds  . albuterol  2.5 mg Nebulization Q6H  . furosemide  120 mg Intravenous Q12H  . heparin  5,000 Units Subcutaneous Q8H  . insulin aspart  0-9 Units Subcutaneous TID WC  . labetalol  600 mg Oral BID  . pantoprazole  40 mg Oral BID  . predniSONE  10 mg Oral Q breakfast  . sodium chloride flush  3 mL Intravenous Q12H  . tacrolimus  4 mg Oral BID       Physical Exam: Vital signs in last 24 hours: Temp:  [97.2 F (36.2 C)-98 F (36.7 C)] 98 F (36.7 C) (11/22 0400) Pulse Rate:  [72-95] 82 (11/22 0859) Resp:  [23-33] 26 (11/22 0354) BP: (109-123)/(72-78) 123/75 (11/22 0354) SpO2:  [94 %-100 %] 97 % (11/22 0859) FiO2 (%):  [40 %] 40 % (11/22 0215) Weight:  [177 lb 11.1 oz (80.6 kg)] 177 lb 11.1 oz (80.6 kg) (11/22 0448) Weight change: -2 lb 5 oz (-1.048 kg) Last BM Date: 10/24/16  Intake/Output from previous day: 11/21 0701 - 11/22 0700 In: 967 [P.O.:840; I.V.:3; IV Piggyback:124] Out: 2075 [Urine:2075] Total I/O In: -  Out: 300 [Urine:300]   Physical Exam: General- pt is awake,alert, oriented to time place and person, Bipap in situ Resp- No acute REsp distress, Rhonchi + CVS- S1S2 regular ij rate and rhythm GIT- BS+, soft, NT, ND EXT- 1+ LE Edema, Cyanosis   Lab Results: CBC  Recent Labs  10/24/16 0049  WBC 4.8  HGB 13.7  HCT 43.0  PLT 207    BMET  Recent Labs  10/24/16 1302 10/25/16 0441  NA 136 137  K 4.5 4.5  CL 102 99*  CO2 26 28  GLUCOSE 142* 110*  BUN 56* 62*  CREATININE 5.86* 6.30*   CALCIUM 8.8* 8.3*    Creat trend 2017  5.8==>6.3   MICRO Recent Results (from the past 240 hour(s))  MRSA PCR Screening     Status: None   Collection Time: 10/24/16  4:00 AM  Result Value Ref Range Status   MRSA by PCR NEGATIVE NEGATIVE Final    Comment:        The GeneXpert MRSA Assay (FDA approved for NASAL specimens only), is one component of a comprehensive MRSA colonization surveillance program. It is not intended to diagnose MRSA infection nor to guide or monitor treatment for MRSA infections.       Lab Results  Component Value Date   CALCIUM 8.3 (L) 10/25/2016   PHOS 5.8 (H) 10/25/2016               Impression: 1)Renal  AKI secondary to ATN                AKI vs CKD progression               CKD stage 4.               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  3)Anemia HGb at goal (9--11)   4)CKD Mineral-Bone Disorder  Phosphorus nearly at goal. Calcium is  at goal.  5)Endo- DM PMD following  6)Electrolytes  Hyperkalemic      Now better NOrmonatremic   7)Acid base Co2 at goal   8) Fluid status-Pt on volume overload. ON Diuretics .      Pt is 2 liters negative      I educated pt about possible need of HD soon, not today though.      Plan:  Pt is negative by nearly 2 liters. Will continue diuresis Will ask for tacro level in am I educated pt about worsening of his GFr and need for HD soon.      Christeena Krogh S 10/25/2016, 9:28 AM

## 2016-10-26 DIAGNOSIS — I350 Nonrheumatic aortic (valve) stenosis: Secondary | ICD-10-CM

## 2016-10-26 LAB — BASIC METABOLIC PANEL
Anion gap: 13 (ref 5–15)
BUN: 68 mg/dL — ABNORMAL HIGH (ref 6–20)
CALCIUM: 8.6 mg/dL — AB (ref 8.9–10.3)
CHLORIDE: 97 mmol/L — AB (ref 101–111)
CO2: 27 mmol/L (ref 22–32)
CREATININE: 6.74 mg/dL — AB (ref 0.61–1.24)
GFR calc non Af Amer: 9 mL/min — ABNORMAL LOW (ref >60)
GFR, EST AFRICAN AMERICAN: 10 mL/min — AB (ref >60)
GLUCOSE: 120 mg/dL — AB (ref 65–99)
Potassium: 3.9 mmol/L (ref 3.5–5.1)
Sodium: 137 mmol/L (ref 135–145)

## 2016-10-26 LAB — GLUCOSE, CAPILLARY
GLUCOSE-CAPILLARY: 120 mg/dL — AB (ref 65–99)
GLUCOSE-CAPILLARY: 128 mg/dL — AB (ref 65–99)
Glucose-Capillary: 116 mg/dL — ABNORMAL HIGH (ref 65–99)
Glucose-Capillary: 248 mg/dL — ABNORMAL HIGH (ref 65–99)

## 2016-10-26 MED ORDER — MYCOPHENOLATE MOFETIL 250 MG PO CAPS
ORAL_CAPSULE | ORAL | Status: AC
  Filled 2016-10-26: qty 4

## 2016-10-26 MED ORDER — TACROLIMUS 1 MG PO CAPS
ORAL_CAPSULE | ORAL | Status: AC
  Filled 2016-10-26: qty 4

## 2016-10-26 MED ORDER — LOPERAMIDE HCL 2 MG PO CAPS: 2.0000 mg | ORAL_CAPSULE | ORAL | Status: DC | PRN

## 2016-10-26 NOTE — Progress Notes (Signed)
Subjective: Interval History: has no complaint of nausea or vomiting. Patient also at this moment denies and difficulty breathing. His appetite has been poor for some kind and no significant change..  Objective: Vital signs in last 24 hours: Temp:  [97.5 F (36.4 C)-98.5 F (36.9 C)] 97.5 F (36.4 C) (11/23 0526) Pulse Rate:  [58-91] 82 (11/23 0908) Resp:  [18-20] 20 (11/23 0526) BP: (105-141)/(61-98) 115/81 (11/23 0908) SpO2:  [96 %-100 %] 97 % (11/23 0908) FiO2 (%):  [40 %] 40 % (11/23 0119) Weight:  [80.5 kg (177 lb 8 oz)] 80.5 kg (177 lb 8 oz) (11/23 0526) Weight change: -0.086 kg (-3.1 oz)  Intake/Output from previous day: 11/22 0701 - 11/23 0700 In: 844 [P.O.:720; IV Piggyback:124] Out: 1000 [Urine:1000] Intake/Output this shift: No intake/output data recorded.  General appearance: alert, cooperative and no distress Resp: clear to auscultation bilaterally Cardio: regular rate and rhythm GI: soft, non-tender; bowel sounds normal; no masses,  no organomegaly Extremities: edema 1+ edema bilaterally  Lab Results:  Recent Labs  10/24/16 0049  WBC 4.8  HGB 13.7  HCT 43.0  PLT 207   BMET:  Recent Labs  10/24/16 1302 10/25/16 0441  NA 136 137  K 4.5 4.5  CL 102 99*  CO2 26 28  GLUCOSE 142* 110*  BUN 56* 62*  CREATININE 5.86* 6.30*  CALCIUM 8.8* 8.3*   No results for input(s): PTH in the last 72 hours. Iron Studies: No results for input(s): IRON, TIBC, TRANSFERRIN, FERRITIN in the last 72 hours.  Studies/Results: [place] Venous Img Lower Bilateral  Result Date: 10/24/2016 CLINICAL DATA:  50 year old male with a history of bilateral lower extremity edema. EXAM: BILATERAL LOWER EXTREMITY VENOUS DOPPLER ULTRASOUND TECHNIQUE: Gray-scale sonography with graded compression, as well as color Doppler and duplex ultrasound were performed to evaluate the lower extremity deep venous systems from the level of the common femoral vein and including the common femoral, femoral,  profunda femoral, popliteal and calf veins including the posterior tibial, peroneal and gastrocnemius veins when visible. The superficial great saphenous vein was also interrogated. Spectral Doppler was utilized to evaluate flow at rest and with distal augmentation maneuvers in the common femoral, femoral and popliteal veins. COMPARISON:  None. FINDINGS: RIGHT LOWER EXTREMITY Common Femoral Vein: No evidence of thrombus. Normal compressibility, respiratory phasicity and response to augmentation. Saphenofemoral Junction: No evidence of thrombus. Normal compressibility and flow on color Doppler imaging. Profunda Femoral Vein: No evidence of thrombus. Normal compressibility and flow on color Doppler imaging. Femoral Vein: No evidence of thrombus. Normal compressibility, respiratory phasicity and response to augmentation. Popliteal Vein: No evidence of thrombus. Normal compressibility, respiratory phasicity and response to augmentation. Calf Veins: No evidence of thrombus. Normal compressibility and flow on color Doppler imaging. Superficial Great Saphenous Vein: No evidence of thrombus. Normal compressibility and flow on color Doppler imaging. Other Findings:  Lower extremity edema. LEFT LOWER EXTREMITY Common Femoral Vein: No evidence of thrombus. Normal compressibility, respiratory phasicity and response to augmentation. Saphenofemoral Junction: No evidence of thrombus. Normal compressibility and flow on color Doppler imaging. Profunda Femoral Vein: No evidence of thrombus. Normal compressibility and flow on color Doppler imaging. Femoral Vein: No evidence of thrombus. Normal compressibility, respiratory phasicity and response to augmentation. Popliteal Vein: No evidence of thrombus. Normal compressibility, respiratory phasicity and response to augmentation. Calf Veins: No evidence of thrombus. Normal compressibility and flow on color Doppler imaging. Superficial Great Saphenous Vein: No evidence of thrombus. Normal  compressibility and flow on color Doppler imaging. Other  Findings:  Lower extremity edema. IMPRESSION: Sonographic survey of the bilateral lower extremities negative for DVT. Bilateral lower extremity edema. Signed, Yvone NeuJaime S. Loreta AveWagner, DO [facility] Electronically Signed   By: Gilmer MorJaime  Wagner D.O.   On: 10/24/2016 17:50    I have reviewed the patient's current medications.  Assessment/Plan: Problem #1 renal failure possibly acute on chronic versus natural progression of his chronic kidney disease. Patient is status post kidney transplant and presently he's on no suppressive treatment. Patient creatinine continued to increase possibly related to fluid removal. Basic metabolic panel is pending from today. Problem #2 history of CHF: He is on Lasix. He has elected off urine output. Patient is still with edema but denies any difficulty breathing. Problem #3 history of kidney transplant. He has  stage IV chronic renal failure   Problem #4 hypertension: His blood pressure is reasonably controlled Problem #5 hyperkalemia: Potassium is normal Problem #6 history of diabetes Problem #7 anemia: His hemoglobin is stable. Plan: 1] creatinine about 4 or We'll DC Lasix 2]We'll start on Demadex 60 mg by mouth 3]We'll admit her as on 5 mg by mouth twice a day 4]We'll call his primary nephrologist Dr. Sherin QuarryFred Ballenger is patient may require dialysis very soon especially if his renal function doesn't improve. Ph# [phone] . We'll try to call tomorrow his office is open. If patient required dialysis in between possibly will consider putting a catheter and dialyze him. 4]]We'll check his renal panel in the morning.   LOS: 2 days   Henry Gibson S 10/26/2016,10:01 AM

## 2016-10-26 NOTE — Progress Notes (Signed)
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 Admitted for acute kidney injury superimposed on chronic kidney disease with volume overload, pulmonary edema; acute hypoxic respiratory failure secondary to volume overload, hyperkalemia.  Assessment/Plan: 1. AKI superimposed on CKD stage IV versus CKD progression. Creatinine continues to worsen. Urine output 1 L.  2. Acute hypoxic respiratory failure secondary to volume overload. Wean oxygen as tolerated. Appears comfortable. 3. Volume overload secondary to above. Bilateral lower extremity venous Doppler negative for DVT. 4. Stable moderate aortic stenosis. 5. Hyperkalemia secondary to acute kidney injury, resolved. 6. Hypertension remained stable. 7. Diabetes mellitus type 2, remained stable. Sliding scale insulin. 8. Anemia of chronic kidney disease. 9. Chronic diastolic congestive heart failure. 10. Chronic bronchitis. 11. [facility] for shortness of breath, chronic bronchitis. [facility], status post arthrocentesis of the knee.   Discussed with nephrology, may need to initiate hemodialysis getting worsening renal function. Nephrology will discuss with primary nephrologist [place] to coordinate care.  Further management per nephrology.  DVT prophylaxis: Heparin Code Status: Full Family Communication: None Disposition Plan: Discharge home once improved  Brendia Sacksaniel Laporche Martelle, MD  [facility] Direct contact: [phone] --Via amion app OR  --[email or web address]; password [email or web address]  7PM-7AM contact night coverage as above 10/26/2016, 7:38 AM  LOS: 2 days   Consultants:  Nephrology  Procedures:  Echo - Moderate LVH with  LVEF 65-70%. Grade 2 diastolic dysfunction. Severe left atrial enlargement. Moderately calcified mitral annulus with moderate, eccentric mitral regurgitation. Aortic valve is moderately calcified with evidence of overall moderate aortic stenosis as outlined above based on mean gradient and dimensionless index. There is trivial aortic regurgitation. Outside echocardiogram report from June 2016 indicates presence of moderate aortic stenosis at that time with mean gradient 19 mmHg. Mildly dilated right ventricle with normal contraction. Trivial tricuspid regurgitation.  Antimicrobials:  None  CC: Follow-up volume overload and SOB.  Interval history/Subjective: Patient is feeling well. No current nausea and vomiting. Patient's urine output is normal. Breathing well. No pain other than right leg.  Objective: Vitals:   10/25/16 2100 10/26/16 0119 10/26/16 0526 10/26/16 0734  BP: (!) 141/97  105/61   Pulse: 91 77 (!) 58   Resp: 20 18 20    Temp: 98.5 F (36.9 C)  97.5 F (36.4 C)   TempSrc: Oral  Oral   SpO2: 96% 98% 100% 97%  Weight:   80.5 kg (177 lb 8 oz)   Height:   5\' 7"  (1.702 m)     Intake/Output Summary (Last 24 hours) at 10/26/16 0738 Last data filed at 10/26/16 0300  Gross per 24 hour  Intake              844 ml  Output             1000 ml  Net             -156 ml     Filed Weights   10/24/16 0400 10/25/16 0448 10/26/16 0526  Weight: 82.6 kg (182 lb 1.6 oz) 80.6 kg (177 lb 11.1 oz) 80.5 kg (177 lb 8 oz)    Exam:  Constitutional:  . Appears calm and comfortable Respiratory:  . CTA bilaterally. Bilateral posterior rales. No rhonchi. No wheezes. .Marland Kitchen  Respiratory effort normal. No retractions or accessory muscle use Cardiovascular:  . RRR, 3/6 holosystolic murmur. No r/g . 2+ LE extremity edema, right greater than left . Normal pedal pulses  I have personally reviewed following labs and imaging studies:  Glucose 120, stable.  BUN up to 68,  creatinine 6.74  Potassium 3.9  Urine output 1 L  Scheduled Meds: . albuterol  2.5 mg Nebulization Q6H  . aspirin  81 mg Oral Daily  . furosemide  120 mg Intravenous Q12H  . heparin  5,000 Units Subcutaneous Q8H  . insulin aspart  0-9 Units Subcutaneous TID WC  . labetalol  600 mg Oral BID  . mycophenolate  1,000 mg Oral BID  . pantoprazole  40 mg Oral BID  . predniSONE  10 mg Oral Q breakfast  . sodium chloride flush  3 mL Intravenous Q12H  . tacrolimus  4 mg Oral BID   Continuous Infusions:  Principal Problem:   AKI (acute kidney injury) (HCC) Active Problems:   Edema extremities   Hypervolemia   Chronic kidney disease (CKD), stage IV (severe) (HCC)   Acute respiratory failure with hypoxia (HCC)   Chronic diastolic CHF (congestive heart failure) (HCC)   Moderate aortic stenosis   History of anemia due to CKD   DM type 2 (diabetes mellitus, type 2) (HCC)   LOS: 2 days     By signing my name below, I, Nikki DomMaleeha Khan, attest that this documentation has been prepared under the direction and in the presence of Tymel Conely P. Irene LimboGoodrich, MD. Electronically signed: Nikki DomMaleeha Khan, Scribe.  10/26/16 9:26 AM   I personally performed the services described in this documentation. All medical record entries made by the scribe were at my direction. I have reviewed the chart and agree that the record reflects my personal performance and is accurate and complete. Brendia Sacksaniel Marizol Borror, MD

## 2016-10-26 NOTE — Progress Notes (Signed)
SATURATION QUALIFICATIONS: (This note is used to comply with regulatory documentation for home oxygen)  Patient Saturations on Room Air at Rest = 97%  Patient Saturations on Room Air while Ambulating = 94%  

## 2016-10-27 LAB — GLUCOSE, CAPILLARY
GLUCOSE-CAPILLARY: 117 mg/dL — AB (ref 65–99)
GLUCOSE-CAPILLARY: 203 mg/dL — AB (ref 65–99)

## 2016-10-27 LAB — BASIC METABOLIC PANEL
Anion gap: 13 (ref 5–15)
BUN: 68 mg/dL — ABNORMAL HIGH (ref 6–20)
CHLORIDE: 96 mmol/L — AB (ref 101–111)
CO2: 29 mmol/L (ref 22–32)
Calcium: 9 mg/dL (ref 8.9–10.3)
Creatinine, Ser: 6.63 mg/dL — ABNORMAL HIGH (ref 0.61–1.24)
GFR, EST AFRICAN AMERICAN: 10 mL/min — AB (ref >60)
GFR, EST NON AFRICAN AMERICAN: 9 mL/min — AB (ref >60)
Glucose, Bld: 115 mg/dL — ABNORMAL HIGH (ref 65–99)
POTASSIUM: 3.5 mmol/L (ref 3.5–5.1)
SODIUM: 138 mmol/L (ref 135–145)

## 2016-10-27 MED ORDER — METOLAZONE 2.5 MG PO TABS: 2.5000 mg | ORAL_TABLET | Freq: Every day | ORAL | 0 refills | Status: AC | PRN

## 2016-10-27 MED ORDER — TORSEMIDE 20 MG PO TABS
60.0000 mg | ORAL_TABLET | Freq: Every day | ORAL | Status: DC
  Administered 2016-10-27: 60 mg via ORAL
  Filled 2016-10-27: qty 3

## 2016-10-27 MED ORDER — ALBUTEROL SULFATE (2.5 MG/3ML) 0.083% IN NEBU: 2.5000 mg | INHALATION_SOLUTION | RESPIRATORY_TRACT | Status: DC | PRN

## 2016-10-27 MED ORDER — TORSEMIDE 20 MG PO TABS: 60.0000 mg | ORAL_TABLET | Freq: Every day | ORAL | 0 refills | Status: AC

## 2016-10-27 MED ORDER — LABETALOL HCL 300 MG PO TABS: 600.0000 mg | ORAL_TABLET | Freq: Two times a day (BID) | ORAL | Status: AC

## 2016-10-27 MED ORDER — METOLAZONE 5 MG PO TABS: 2.5000 mg | ORAL_TABLET | Freq: Every day | ORAL | Status: DC | PRN

## 2016-10-27 NOTE — Progress Notes (Signed)
Patient with orders to be discharge home. Discharge instructions given, patient verbalized understanding. Patient stable. Patient left in private vehicle with spouse.  

## 2016-10-27 NOTE — Progress Notes (Signed)
Subjective: Interval History: Patient remains asymptomatic. His appetite is good and no nausea or vomiting. Patient also denies any difficulty breathing..  Objective: Vital signs in last 24 hours: Temp:  [97.9 F (36.6 C)-98.5 F (36.9 C)] 98.5 F (36.9 C) (11/24 0513) Pulse Rate:  [61-91] 85 (11/24 0513) Resp:  [18-24] 19 (11/24 0513) BP: (109-144)/(70-96) 144/96 (11/24 0513) SpO2:  [90 %-100 %] 97 % (11/24 0727) Weight:  [77.7 kg (171 lb 4.8 oz)] 77.7 kg (171 lb 4.8 oz) (11/24 0513) Weight change: -2.812 kg (-6 lb 3.2 oz)  Intake/Output from previous day: 11/23 0701 - 11/24 0700 In: 593 [P.O.:480; I.V.:6; IV Piggyback:107] Out: 750 [Urine:750] Intake/Output this shift: No intake/output data recorded.  General appearance: alert, cooperative and no distress Resp: clear to auscultation bilaterally Cardio: regular rate and rhythm GI: soft, non-tender; bowel sounds normal; no masses,  no organomegaly Extremities: edema 1+ edema bilaterally  Lab Results: No results for input(s): WBC, HGB, HCT, PLT in the last 72 hours. BMET:   Recent Labs  10/26/16 0557 10/27/16 0604  NA 137 138  K 3.9 3.5  CL 97* 96*  CO2 27 29  GLUCOSE 120* 115*  BUN 68* 68*  CREATININE 6.74* 6.63*  CALCIUM 8.6* 9.0   No results for input(s): PTH in the last 72 hours. Iron Studies: No results for input(s): IRON, TIBC, TRANSFERRIN, FERRITIN in the last 72 hours.  Studies/Results: No results found.  I have reviewed the patient's current medications.  Assessment/Plan: Problem #1 renal failure possibly acute on chronic versus natural progression of his chronic kidney disease. His renal function seems to be stabilizing. Presently he is asymptomatic. Problem #2 history of CHF: Patient on Demadex and metolazone. Patient says that he is making urine but does this moment does not seem to be documented very well. He has lost about 3 kg since yesterday. Hence seems to be diuresing very well. Problem #3  history of kidney transplant. He has  stage IV chronic renal failure   Problem #4 hypertension: His blood pressure is reasonably controlled Problem #5 hyperkalemia: Potassium is normal Problem #6 history of diabetes: Denies any polyuria or polydipsia. Problem #7 anemia: His hemoglobin is stable. Plan: 1] patient does not require dialysis. 2] we'll continue with Demadex and metolazone for now. 3] I tried to call his  primary nephrologist Dr. Sherin QuarryFred Ballenger  At  Ph# [phone] . Their office is closed for the holiday. 4]]We'll check his renal panel in the morning.   LOS: 3 days   Derrek Puff S 10/27/2016,9:09 AM

## 2016-10-27 NOTE — Progress Notes (Signed)
Patient encouraged to measure urine output, but urinated in toilet without measuring. Patient states that he urinated 8 times this shift.

## 2016-10-27 NOTE — Discharge Summary (Addendum)
Physician Discharge Summary  Henry Gibson ZOX:[id number] DOB: 30-Jun-1966 DOA: 10/24/2016  PCP: No PCP Per Patient  Admit date: 10/24/2016 Discharge date: 10/27/2016  Recommendations for Outpatient Follow-up:  1. Call primary nephrologist Dr. Sherin Quarry at  Ph# [phone] Monday to coordinate outpatient follow-up. Office closed on discharge. 2. Follow-up progressive chronic kidney disease. Likely will need hemodialysis within the next few weeks. 3. Glipizide on hold given worsening renal function and stable blood sugars. Patient will continue to monitor his blood sugars.   Discharge Diagnoses:  1. Acute kidney injury superimposed on chronic kidney disease stage IV 2. Acute hypoxic respiratory failure secondary to volume overload 3. Volume overload secondary to chronic kidney disease 4. Hyperkalemia 5. Moderate aortic stenosis 6. Diabetes mellitus type 2 7. Anemia secondary to chronic kidney disease 8. 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 Admitted for acute kidney injury superimposed on chronic kidney disease with volume overload, pulmonary edema; acute hypoxic respiratory failure secondary to volume overload, hyperkalemia.  Hospital Course:  Patient was seen by nephrology and responded well to IV diuretics with marked improvement in respiratory status and resolution of hypoxia. Renal function stabilized. He is under the care of a nephrologist in [place] and receives all his follow-up care there. In discussion with Dr.  Kristian Covey on the day of discharge, recommendation was for close outpatient follow-up, daily Demadex, metolazone as needed. The patient was not felt to currently require hemodialysis and repeat lab work in one week was recommended. In my discussion with nephrology, he was cleared for discharge. The patient is motivated and committed to following up.  1. AKIsuperimposed on CKD stage IV versus CKD progression. Adequate urine output. Renal function has stabilized. 2. Acute hypoxic respiratory failure secondary to volume overload. Hypoxia resolved. 3. Volume overload secondary to above. Bilateral lower extremity venous Doppler negative for DVT. 4. Stable moderate aortic stenosis. 5. Hyperkalemia secondary to acute kidney injury, resolved. 6. Hypertension remained stable. 7. Diabetes mellitus type 2, remained stable.  8. Anemia of chronic kidney disease. 9. Chronic diastolic congestive heart failure.  Consultants:  Nephrology  Procedures:  Echo - Moderate LVH with LVEF 65-70%. Grade 2 diastolic dysfunction. Severe left atrial enlargement. Moderately calcified mitral annulus with moderate, eccentric mitral regurgitation. Aortic valve is moderately calcified with evidence of overall moderate aortic stenosis as outlined above based on mean gradient and dimensionless index. There is trivial aortic regurgitation. Outside echocardiogram report from June 2016 indicates presence of moderate aortic stenosis at that time with mean gradient 19 mmHg. Mildly dilated right ventricle with normal contraction. Trivial tricuspid regurgitation.  Antimicrobials:  None  Subjective: Feeling well. Breathing well. No complaints. Objective: Appears calm and comfortable. Speech fluent and clear. Cardiovascular regular rate and rhythm. No murmur rub or gallop. 1-2 plus bilateral lower extremity edema right greater than left. Respiratory clear to auscultation bilaterally. No wheezes, rales or  rhonchi. Normal respiratory effort.  Discharge Instructions  Discharge Instructions    Activity as tolerated - No restrictions    Complete by:  As  directed    Diet - low sodium heart healthy    Complete by:  As directed    Diet Carb Modified    Complete by:  As directed    Discharge instructions    Complete by:  As directed    Call your physician or seek immediate medical attention for swelling, weight gain, shortness of breath or blood sugar over 400 or less than 70.       Medication List    STOP taking these medications   amLODipine 10 MG tablet Commonly known as:  NORVASC   furosemide 40 MG tablet Commonly known as:  LASIX   glipiZIDE 5 MG tablet Commonly known as:  GLUCOTROL     TAKE these medications   albuterol 108 (90 Base) MCG/ACT inhaler Commonly known as:  PROVENTIL HFA;VENTOLIN HFA Inhale 1 puff into the lungs every 4 (four) hours as needed for shortness of breath.   aspirin 81 MG chewable tablet Chew 1 tablet by mouth daily.   cloNIDine 0.1 MG tablet Commonly known as:  CATAPRES Take 0.5 mg by mouth at bedtime.   colchicine 0.6 MG tablet Take 1 tablet by mouth daily as needed (gout).   labetalol 300 MG tablet Commonly known as:  NORMODYNE Take 2 tablets (600 mg total) by mouth 2 (two) times daily. What changed:  how much to take   metolazone 2.5 MG tablet Commonly known as:  ZAROXOLYN Take 1 tablet (2.5 mg total) by mouth daily as needed (swelling, weight gain).   mycophenolate 250 MG capsule Commonly known as:  CELLCEPT Take 1,000 mg by mouth 2 (two) times daily.   pantoprazole 40 MG tablet Commonly known as:  PROTONIX Take 40 mg by mouth 2 (two) times daily.   predniSONE 10 MG tablet Commonly known as:  DELTASONE Take 10 mg by mouth daily with breakfast.   tacrolimus 1 MG capsule Commonly known as:  PROGRAF Take 4 mg by mouth 2 (two) times daily.   torsemide 20 MG tablet Commonly known as:  DEMADEX Take 3 tablets (60 mg total) by  mouth daily.      No Known Allergies  The results of significant diagnostics from this hospitalization (including imaging, microbiology, ancillary and laboratory) are listed below for reference.    Significant Diagnostic Studies: Dg Chest 2 View  Result Date: 10/24/2016 CLINICAL DATA:  Acute onset of worsening shortness of breath and congestion. Initial encounter. EXAM: CHEST  2 VIEW COMPARISON:  None. FINDINGS: The lungs are well-aerated. Vascular congestion is noted. Increased interstitial markings and left midlung opacity may reflect pulmonary edema or pneumonia. Small bilateral pleural effusions are seen. No pneumothorax is identified. The heart is mildly enlarged. No acute osseous abnormalities are seen. IMPRESSION: Vascular congestion and mild cardiomegaly. Increased interstitial markings and left midlung opacity may reflect pulmonary edema or pneumonia. Small bilateral pleural effusions seen. Electronically Signed   By: Roanna Raider M.D.   On: 10/24/2016 01:44   US Venous Img Lower Bilateral  Result Date: 10/24/2016 CLINICAL DATA:  50 year old male with a history of bilateral lower extremity edema. EXAM: BILATERAL LOWER EXTREMITY VENOUS DOPPLER ULTRASOUND TECHNIQUE: Gray-scale sonography with graded compression, as well as color Doppler and duplex ultrasound were performed to evaluate the lower extremity deep venous systems from the level of the common femoral vein and including the common femoral, femoral, profunda femoral, popliteal and calf veins including the posterior tibial, peroneal and gastrocnemius veins when visible. The superficial great saphenous vein was also interrogated. Spectral Doppler was  utilized to evaluate flow at rest and with distal augmentation maneuvers in the common femoral, femoral and popliteal veins. COMPARISON:  None. FINDINGS: RIGHT LOWER EXTREMITY Common Femoral Vein: No evidence of thrombus. Normal compressibility, respiratory phasicity and response to  augmentation. Saphenofemoral Junction: No evidence of thrombus. Normal compressibility and flow on color Doppler imaging. Profunda Femoral Vein: No evidence of thrombus. Normal compressibility and flow on color Doppler imaging. Femoral Vein: No evidence of thrombus. Normal compressibility, respiratory phasicity and response to augmentation. Popliteal Vein: No evidence of thrombus. Normal compressibility, respiratory phasicity and response to augmentation. Calf Veins: No evidence of thrombus. Normal compressibility and flow on color Doppler imaging. Superficial Great Saphenous Vein: No evidence of thrombus. Normal compressibility and flow on color Doppler imaging. Other Findings:  Lower extremity edema. LEFT LOWER EXTREMITY Common Femoral Vein: No evidence of thrombus. Normal compressibility, respiratory phasicity and response to augmentation. Saphenofemoral Junction: No evidence of thrombus. Normal compressibility and flow on color Doppler imaging. Profunda Femoral Vein: No evidence of thrombus. Normal compressibility and flow on color Doppler imaging. Femoral Vein: No evidence of thrombus. Normal compressibility, respiratory phasicity and response to augmentation. Popliteal Vein: No evidence of thrombus. Normal compressibility, respiratory phasicity and response to augmentation. Calf Veins: No evidence of thrombus. Normal compressibility and flow on color Doppler imaging. Superficial Great Saphenous Vein: No evidence of thrombus. Normal compressibility and flow on color Doppler imaging. Other Findings:  Lower extremity edema. IMPRESSION: Sonographic survey of the bilateral lower extremities negative for DVT. Bilateral lower extremity edema. Signed, Yvone NeuJaime S. Loreta AveWagner, DO [facility] Electronically Signed   By: Gilmer MorJaime  Wagner D.O.   On: 10/24/2016 17:50    Microbiology: Recent Results (from the past 240 hour(s))  MRSA PCR Screening     Status: None    Collection Time: 10/24/16  4:00 AM  Result Value Ref Range Status   MRSA by PCR NEGATIVE NEGATIVE Final    Comment:        The GeneXpert MRSA Assay (FDA approved for NASAL specimens only), is one component of a comprehensive MRSA colonization surveillance program. It is not intended to diagnose MRSA infection nor to guide or monitor treatment for MRSA infections.      Labs: Basic Metabolic Panel:  Recent Labs Lab 10/24/16 0049 10/24/16 1302 10/25/16 0441 10/26/16 0557 10/27/16 0604  NA 136 136 137 137 138  K 5.7* 4.5 4.5 3.9 3.5  CL 104 102 99* 97* 96*  CO2 23 26 28 27 29   GLUCOSE 193* 142* 110* 120* 115*  BUN 56* 56* 62* 68* 68*  CREATININE 5.83* 5.86* 6.30* 6.74* 6.63*  CALCIUM 8.9 8.8* 8.3* 8.6* 9.0  PHOS  --  5.5* 5.8*  --   --    Liver Function Tests:  Recent Labs Lab 10/24/16 1302 10/25/16 0441  ALBUMIN 2.8* 2.9*   CBC:  Recent Labs Lab 10/24/16 0049  WBC 4.8  NEUTROABS 4.4  HGB 13.7  HCT 43.0  MCV 89.2  PLT 207   Cardiac Enzymes:  Recent Labs Lab 10/24/16 0049  TROPONINI <0.03     Recent Labs  10/24/16 0049  BNP 670.0*    CBG:  Recent Labs Lab 10/26/16 1133 10/26/16 1623 10/26/16 2103 10/27/16 0736 10/27/16 1117  GLUCAP 116* 248* 128* 117* 203*    Principal Problem:   AKI (acute kidney injury) (HCC) Active Problems:   Edema extremities   Hypervolemia   Chronic kidney disease (CKD), stage IV (severe) (HCC)   Acute respiratory  failure with hypoxia (HCC)   Chronic diastolic CHF (congestive heart failure) (HCC)   Moderate aortic stenosis   History of anemia due to CKD   DM type 2 (diabetes mellitus, type 2) (HCC)   Time coordinating discharge: 40 minutes  Signed:  Brendia Sacksaniel Dreana Britz, MD [facility] 10/27/2016, 3:38 PM

## 2016-10-29 LAB — TACROLIMUS LEVEL: Tacrolimus (FK506) - LabCorp: 4.8 ng/mL (ref 2.0–20.0)

## 2018-02-01 DEATH — deceased

## 2018-06-12 IMAGING — US US EXTREM LOW VENOUS BILAT
1 series · 13 of 24 positions shown · non-contrast
Comparison: None.

CLINICAL DATA: 50-year-old male with a history of bilateral lower
extremity edema.



[Series 1: us extrem low venous bilat · 0.10mm/px · 13 of 106 slices shown]
[im 1/106]
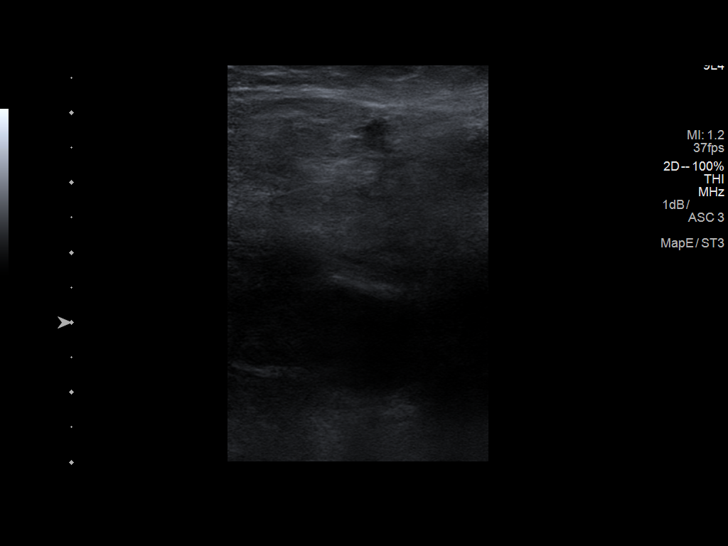
[im 10/106]
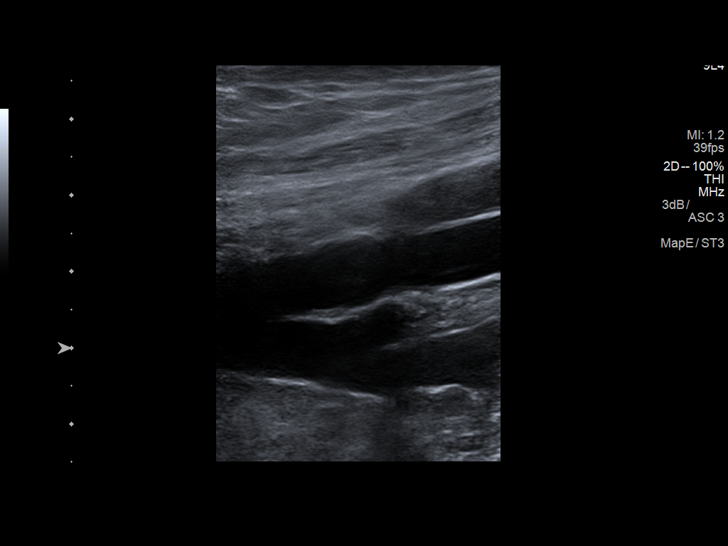
[im 19/106]
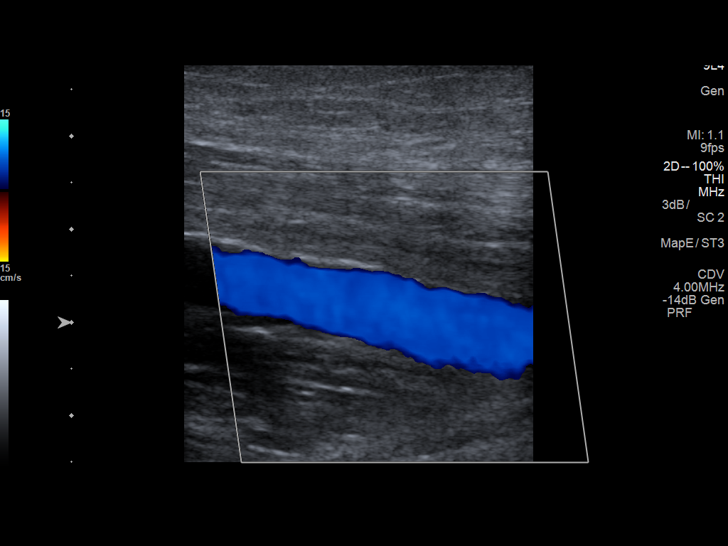
[im 28/106]
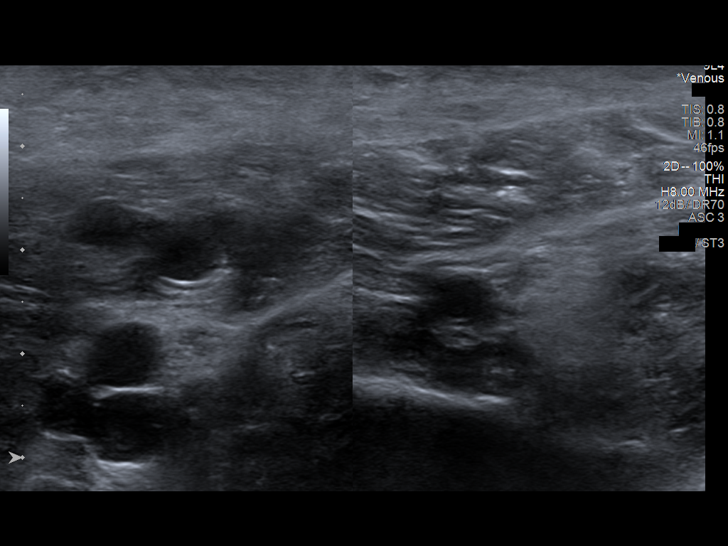
[im 37/106]
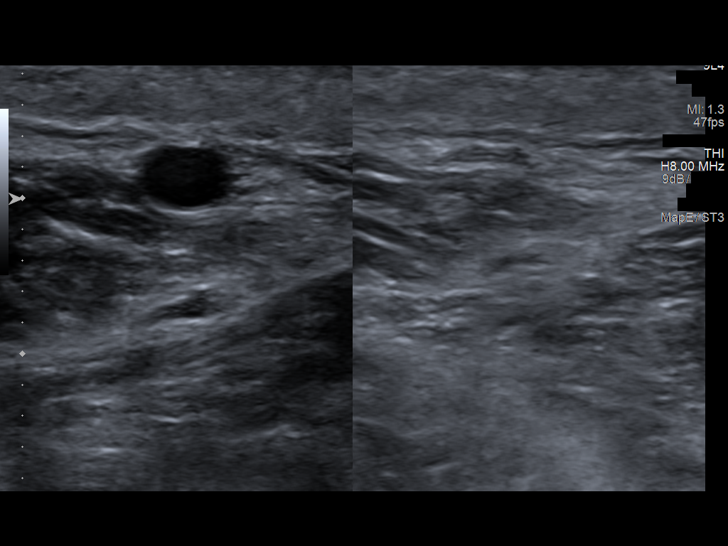
[im 46/106]
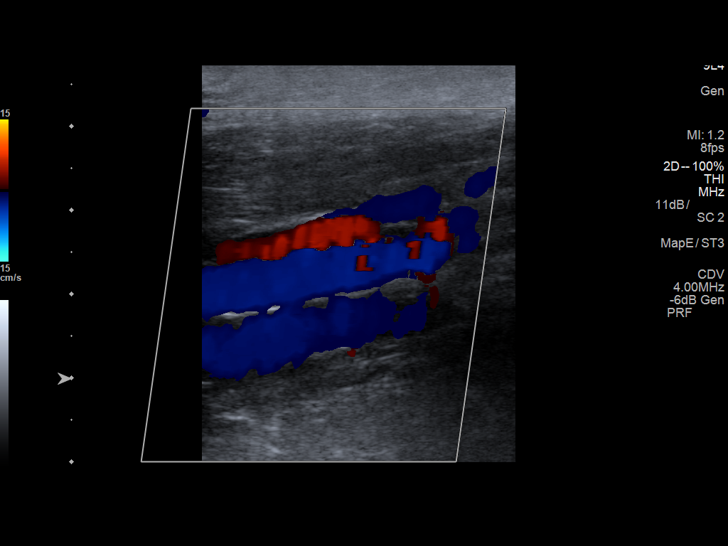
[im 55/106]
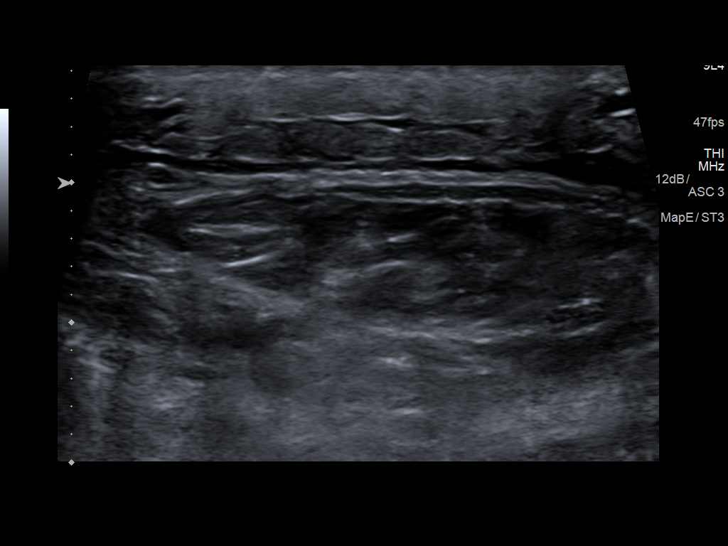
[im 60/106]
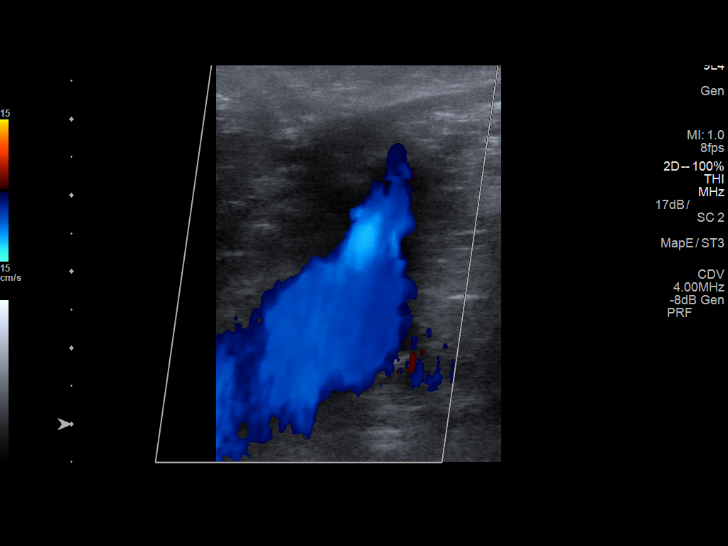
[im 69/106]
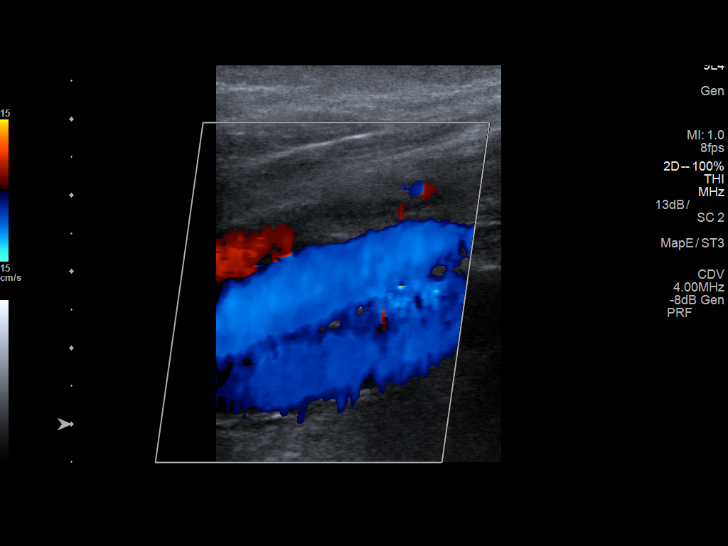
[im 78/106]
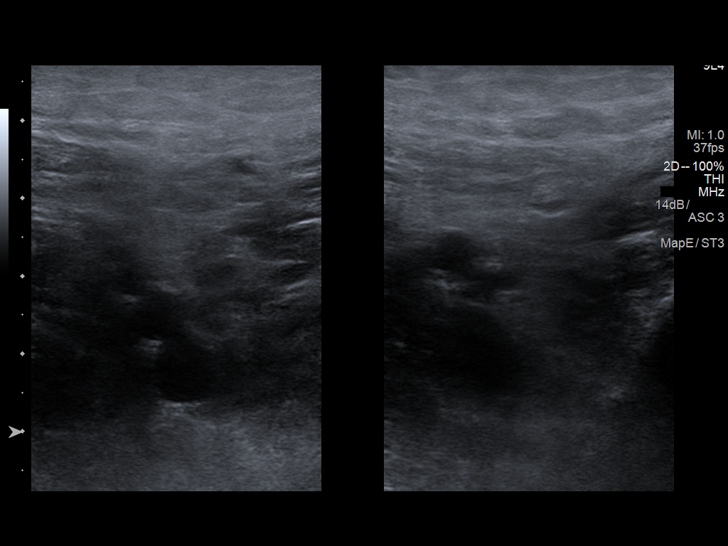
[im 87/106]
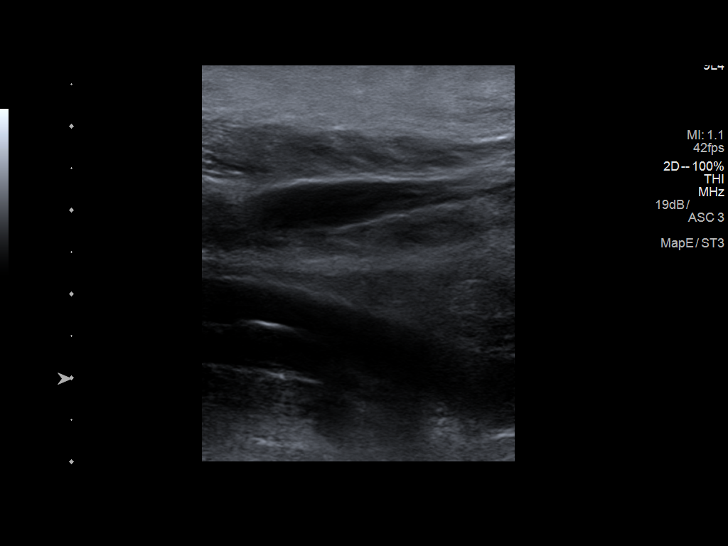
[im 96/106]
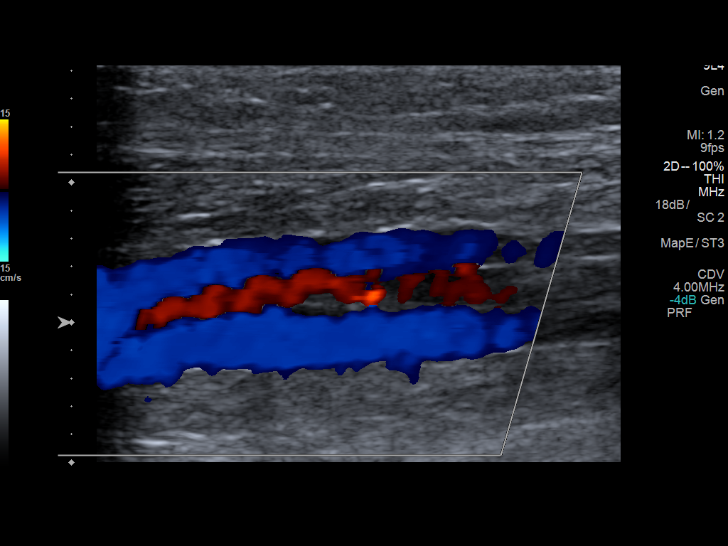
[im 106/106]
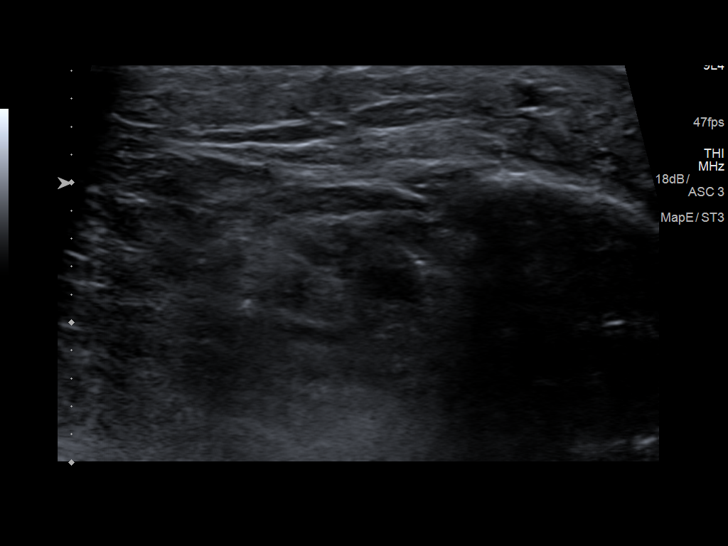

[13 of 24 positions shown; findings below may reference images not displayed]

FINDINGS: RIGHT LOWER EXTREMITY

Common Femoral Vein: No evidence of thrombus. Normal
compressibility, respiratory phasicity and response to augmentation.

Saphenofemoral Junction: No evidence of thrombus. Normal
compressibility and flow on color Doppler imaging.

Profunda Femoral Vein: No evidence of thrombus. Normal
compressibility and flow on color Doppler imaging.

Femoral Vein: No evidence of thrombus. Normal compressibility,
respiratory phasicity and response to augmentation.

Popliteal Vein: No evidence of thrombus. Normal compressibility,
respiratory phasicity and response to augmentation.

Calf Veins: No evidence of thrombus. Normal compressibility and flow
on color Doppler imaging.

Superficial Great Saphenous Vein: No evidence of thrombus. Normal
compressibility and flow on color Doppler imaging.

Other Findings:  Lower extremity edema.

LEFT LOWER EXTREMITY

Common Femoral Vein: No evidence of thrombus. Normal
compressibility, respiratory phasicity and response to augmentation.

Saphenofemoral Junction: No evidence of thrombus. Normal
compressibility and flow on color Doppler imaging.

Profunda Femoral Vein: No evidence of thrombus. Normal
compressibility and flow on color Doppler imaging.

Femoral Vein: No evidence of thrombus. Normal compressibility,
respiratory phasicity and response to augmentation.

Popliteal Vein: No evidence of thrombus. Normal compressibility,
respiratory phasicity and response to augmentation.

Calf Veins: No evidence of thrombus. Normal compressibility and flow
on color Doppler imaging.

Superficial Great Saphenous Vein: No evidence of thrombus. Normal
compressibility and flow on color Doppler imaging.

Other Findings:  Lower extremity edema.
IMPRESSION: Sonographic survey of the bilateral lower extremities negative for
DVT.

Bilateral lower extremity edema.
